# Patient Record
Sex: Female | Born: 1970 | Race: Black or African American | Hispanic: No | Marital: Married | Smoking: Current every day smoker
Health system: Southern US, Community
[De-identification: ages and names within clinical notes are randomized; demographics above are authoritative.]

## PROBLEM LIST (undated history)

## (undated) ENCOUNTER — Ambulatory Visit (HOSPITAL_COMMUNITY): Admission: EM | Payer: 59 | Source: Home / Self Care

## (undated) DIAGNOSIS — I10 Essential (primary) hypertension: Secondary | ICD-10-CM

## (undated) DIAGNOSIS — M109 Gout, unspecified: Secondary | ICD-10-CM

---

## 1998-03-31 ENCOUNTER — Emergency Department (HOSPITAL_COMMUNITY): Admission: EM | Admit: 1998-03-31 | Discharge: 1998-03-31 | Payer: Self-pay | Admitting: *Deleted

## 1999-09-11 ENCOUNTER — Emergency Department (HOSPITAL_COMMUNITY): Admission: EM | Admit: 1999-09-11 | Discharge: 1999-09-11 | Payer: Self-pay | Admitting: Emergency Medicine

## 1999-09-11 ENCOUNTER — Encounter: Payer: Self-pay | Admitting: Emergency Medicine

## 2005-02-10 ENCOUNTER — Emergency Department (HOSPITAL_COMMUNITY): Admission: EM | Admit: 2005-02-10 | Discharge: 2005-02-10 | Payer: Self-pay | Admitting: Emergency Medicine

## 2005-12-24 ENCOUNTER — Emergency Department (HOSPITAL_COMMUNITY): Admission: EM | Admit: 2005-12-24 | Discharge: 2005-12-24 | Payer: Self-pay | Admitting: Emergency Medicine

## 2006-05-05 ENCOUNTER — Emergency Department (HOSPITAL_COMMUNITY): Admission: EM | Admit: 2006-05-05 | Discharge: 2006-05-05 | Payer: Self-pay | Admitting: Emergency Medicine

## 2008-05-03 ENCOUNTER — Emergency Department (HOSPITAL_COMMUNITY): Admission: EM | Admit: 2008-05-03 | Discharge: 2008-05-03 | Payer: Self-pay | Admitting: Family Medicine

## 2008-05-04 ENCOUNTER — Emergency Department (HOSPITAL_COMMUNITY): Admission: EM | Admit: 2008-05-04 | Discharge: 2008-05-04 | Payer: Self-pay | Admitting: Emergency Medicine

## 2009-06-11 ENCOUNTER — Emergency Department (HOSPITAL_COMMUNITY): Admission: EM | Admit: 2009-06-11 | Discharge: 2009-06-11 | Payer: Self-pay | Admitting: Emergency Medicine

## 2009-10-10 ENCOUNTER — Emergency Department (HOSPITAL_COMMUNITY): Admission: EM | Admit: 2009-10-10 | Discharge: 2009-10-10 | Payer: Self-pay | Admitting: Family Medicine

## 2009-11-18 ENCOUNTER — Emergency Department (HOSPITAL_COMMUNITY): Admission: EM | Admit: 2009-11-18 | Discharge: 2009-11-18 | Payer: Self-pay | Admitting: Family Medicine

## 2010-10-06 ENCOUNTER — Emergency Department (HOSPITAL_COMMUNITY): Admission: EM | Admit: 2010-10-06 | Discharge: 2010-10-06 | Payer: Self-pay | Admitting: Emergency Medicine

## 2010-12-29 ENCOUNTER — Emergency Department (HOSPITAL_COMMUNITY)
Admission: EM | Admit: 2010-12-29 | Discharge: 2010-12-29 | Payer: Self-pay | Source: Home / Self Care | Admitting: Emergency Medicine

## 2010-12-30 LAB — POCT I-STAT, CHEM 8
BUN: 17 mg/dL (ref 6–23)
Calcium, Ion: 1.24 mmol/L (ref 1.12–1.32)
Chloride: 103 meq/L (ref 96–112)
Creatinine, Ser: 0.9 mg/dL (ref 0.4–1.2)
Glucose, Bld: 98 mg/dL (ref 70–99)
HCT: 47 % — ABNORMAL HIGH (ref 36.0–46.0)
Hemoglobin: 16 g/dL — ABNORMAL HIGH (ref 12.0–15.0)
Potassium: 3.6 mEq/L (ref 3.5–5.1)
Sodium: 141 meq/L (ref 135–145)
TCO2: 21 mmol/L (ref 0–100)

## 2010-12-30 LAB — ABO/RH: ABO/RH(D): A POS

## 2010-12-30 LAB — URINALYSIS, ROUTINE W REFLEX MICROSCOPIC
Bilirubin Urine: NEGATIVE
Ketones, ur: NEGATIVE mg/dL
Leukocytes, UA: NEGATIVE
Nitrite: NEGATIVE
Protein, ur: NEGATIVE mg/dL
Specific Gravity, Urine: 1.008 (ref 1.005–1.030)
Urine Glucose, Fasting: NEGATIVE mg/dL
Urobilinogen, UA: 0.2 mg/dL (ref 0.0–1.0)
pH: 6 (ref 5.0–8.0)

## 2010-12-30 LAB — WET PREP, GENITAL
Trich, Wet Prep: NONE SEEN
WBC, Wet Prep HPF POC: NONE SEEN
Yeast Wet Prep HPF POC: NONE SEEN

## 2010-12-30 LAB — URINE MICROSCOPIC-ADD ON

## 2010-12-30 LAB — HCG, QUANTITATIVE, PREGNANCY: hCG, Beta Chain, Quant, S: 1817 m[IU]/mL — ABNORMAL HIGH (ref <5)

## 2010-12-31 LAB — GC/CHLAMYDIA PROBE AMP, GENITAL
Chlamydia, DNA Probe: NEGATIVE
GC Probe Amp, Genital: NEGATIVE

## 2011-01-03 ENCOUNTER — Inpatient Hospital Stay (HOSPITAL_COMMUNITY)
Admission: AD | Admit: 2011-01-03 | Discharge: 2011-01-03 | Payer: Self-pay | Source: Home / Self Care | Attending: Obstetrics & Gynecology | Admitting: Obstetrics & Gynecology

## 2011-01-03 LAB — CBC
Hemoglobin: 12.7 g/dL (ref 12.0–15.0)
Platelets: 253 10*3/uL (ref 150–400)
RBC: 4.13 MIL/uL (ref 3.87–5.11)
WBC: 5.6 10*3/uL (ref 4.0–10.5)

## 2011-01-03 LAB — DIFFERENTIAL
Basophils Absolute: 0 10*3/uL (ref 0.0–0.1)
Lymphocytes Relative: 34 % (ref 12–46)
Lymphs Abs: 1.9 10*3/uL (ref 0.7–4.0)
Neutrophils Relative %: 52 % (ref 43–77)

## 2011-01-03 LAB — BUN: BUN: 6 mg/dL (ref 6–23)

## 2011-01-03 LAB — CREATININE, SERUM
Creatinine, Ser: 0.67 mg/dL (ref 0.4–1.2)
GFR calc non Af Amer: >60 mL/min (ref >60)

## 2011-01-03 LAB — AST: AST: 23 U/L (ref 0–37)

## 2011-01-03 LAB — HCG, QUANTITATIVE, PREGNANCY: hCG, Beta Chain, Quant, S: 6988 m[IU]/mL — ABNORMAL HIGH (ref <5)

## 2011-01-06 ENCOUNTER — Ambulatory Visit (HOSPITAL_COMMUNITY)
Admission: RE | Admit: 2011-01-06 | Discharge: 2011-01-06 | Payer: Self-pay | Source: Home / Self Care | Attending: Family Medicine | Admitting: Family Medicine

## 2011-01-06 DIAGNOSIS — O00109 Unspecified tubal pregnancy without intrauterine pregnancy: Secondary | ICD-10-CM

## 2011-01-07 HISTORY — PX: LAPAROSCOPY FOR ECTOPIC PREGNANCY: SUR765

## 2011-01-09 ENCOUNTER — Other Ambulatory Visit: Payer: Self-pay | Admitting: Obstetrics and Gynecology

## 2011-01-09 ENCOUNTER — Inpatient Hospital Stay (HOSPITAL_COMMUNITY)
Admission: AD | Admit: 2011-01-09 | Discharge: 2011-01-09 | Disposition: A | Discharge status: Home / Self Care | Payer: Self-pay | Source: Ambulatory Visit | Attending: Obstetrics & Gynecology | Admitting: Obstetrics & Gynecology

## 2011-01-09 ENCOUNTER — Inpatient Hospital Stay (HOSPITAL_COMMUNITY): Payer: Self-pay

## 2011-01-09 ENCOUNTER — Ambulatory Visit (HOSPITAL_COMMUNITY)
Admission: AD | Admit: 2011-01-09 | Discharge: 2011-01-10 | Disposition: A | Discharge status: Home / Self Care | Payer: Self-pay | Source: Ambulatory Visit | Attending: Obstetrics & Gynecology | Admitting: Obstetrics & Gynecology

## 2011-01-09 DIAGNOSIS — O00109 Unspecified tubal pregnancy without intrauterine pregnancy: Secondary | ICD-10-CM | POA: Insufficient documentation

## 2011-01-09 DIAGNOSIS — A5485 Gonococcal peritonitis: Secondary | ICD-10-CM | POA: Insufficient documentation

## 2011-01-09 DIAGNOSIS — N736 Female pelvic peritoneal adhesions (postinfective): Secondary | ICD-10-CM | POA: Insufficient documentation

## 2011-01-09 LAB — SAMPLE TO BLOOD BANK

## 2011-01-09 LAB — CBC
HCT: 34.3 % — ABNORMAL LOW (ref 36.0–46.0)
Hemoglobin: 12.2 g/dL (ref 12.0–15.0)
Hemoglobin: 11.5 g/dL — ABNORMAL LOW (ref 12.0–15.0)
MCH: 31.4 pg (ref 26.0–34.0)
MCHC: 33.2 g/dL (ref 30.0–36.0)
MCV: 94.6 fL (ref 78.0–100.0)
MCV: 92.7 fL (ref 78.0–100.0)
RBC: 3.89 MIL/uL (ref 3.87–5.11)
WBC: 7.5 10*3/uL (ref 4.0–10.5)

## 2011-01-09 LAB — CREATININE, SERUM
Creatinine, Ser: 0.64 mg/dL (ref 0.4–1.2)
GFR calc non Af Amer: >60 mL/min (ref >60)

## 2011-01-09 LAB — DIFFERENTIAL
Basophils Relative: 0 % (ref 0–1)
Eosinophils Absolute: 0.1 10*3/uL (ref 0.0–0.7)
Lymphs Abs: 2.1 10*3/uL (ref 0.7–4.0)
Monocytes Absolute: 0.6 10*3/uL (ref 0.1–1.0)
Monocytes Relative: 12 % (ref 3–12)
Neutro Abs: 2.7 10*3/uL (ref 1.7–7.7)

## 2011-01-09 LAB — AST: AST: 15 U/L (ref 0–37)

## 2011-01-19 NOTE — Op Note (Addendum)
Samantha Olsen, MCANELLY NO.:  [id number]  MEDICAL RECORD NO.:  [id number]           PATIENT TYPE:  I  LOCATION:  [facility]                         FAC[facility]  PHYSICIAN:  Tilda Burrow, M.D. DATE OF BIRTH:  [birth year]  DATE OF PROCEDURE:  01/09/2011 DATE OF DISCHARGE:  01/09/2011                              OPERATIVE REPORT   PREOPERATIVE DIAGNOSIS:  Ruptured right ectopic pregnancy.  POSTOPERATIVE DIAGNOSES: 1. Ruptured right ectopic pregnancy. 2. Extensive pelvic adhesions. 3. Fitz-Hugh-Curtis syndrome with perihepatic adhesions.  PROCEDURES: 1. Laparoscopic right salpingectomy. 2. Lysis of pelvic adhesions. 3. Lysis of perihepatic adhesions.  SURGEON:  Tilda Burrow, MD  ASSISTANT:  Lucina Mellow, DO  ANESTHESIA:  General.  COMPLICATIONS:  None.  FINDINGS: 1. Large ampullary ectopic with generous bleeding in to the pelvis. 2. Extensive thin filmy adhesions and omental adhesions to the pelvis,    particularly in the left adnexa, released sufficiently to identify     the left ovary and the left fallopian tube.  A small area of distal     fimbrial tissue on the left could be identified.  Large right     ampullary ectopic pregnancy.  DESCRIPTION OF PROCEDURE:  The adhesions were freed up, so that the omentum could be elevated.  The patient was placed in Trendelenburg position and the pelvis irrigated somewhat.  Photos were taken.  Photos 2, 3, and 4 showing the dissection of the left pelvis.  The right tube was a large distended tube and proximal to the tube, there was an area of firm nodularity right at the corner of the right tube.  This was felt to represent probably endometriosis, that possibility of salpingitis isthmic nodosa is considered.  Given the firm fibrous hard nature of this, it was felt that salvage in the tube would not result in increased fertility, so salpingectomy was selected.  The mesosalpinx was injected with  Pitressin diluted solution 15 mL of solution with 0.2 units/mL. This resulted in satisfactory blanching and excellent hemostasis. Unipolar cautery was used to take the mesosalpinx down and taken off the tubal stump.  The pedicle was inspected for hemostasis and was quite dry.  Pelvis was irrigated further and then the attention directed to the upper abdomen.  There was extensive old firm adhesions to the liver consistent with Fitz-Hugh-Curtis syndrome.  These were inspected.  There was also a lot of blood in the lateral gutter and perihepatic area.  The adhesions were taken down sharply and were quite avascular.  We then were able to irrigate out the perihepatic blood and then suctioned out the pelvic and the lateral side wall.  Approximately 100 mL of saline was left in the abdomen which still had a bloody color but was dramatically improved from initiation of procedure.  The procedure was considered satisfactorily completed.  The abdomen deflated.  A 100 mL of saline left in the abdomen to help with evacuation of carbon dioxide, then the laparoscopic instruments were removed.  The fascia was closed at the umbilicus and the each incision closed subcuticularly with 4-0 Vicryl.  Steri-Strips were applied.  ADDENDUM:  It should be mentioned that an EndoCatch bag placed through the umbilical port while viewing things through the 5-mm right lower quadrant port was used to extract the ectopic.  The patient's blood type was confirmed as Rh positive and the patient will be given Toradol tonight before discharge.     Tilda Burrow, M.D.     JVF/MEDQ  D:  01/09/2011  T:  01/10/2011  Job:  [id number]  Electronically Signed by Christin Bach M.D. on 01/19/2011 06:34:45 PM

## 2011-03-15 LAB — DIFFERENTIAL
Eosinophils Absolute: 0.1 10*3/uL (ref 0.0–0.7)
Eosinophils Relative: 1 % (ref 0–5)
Lymphs Abs: 1.7 10*3/uL (ref 0.7–4.0)
Monocytes Absolute: 0.5 10*3/uL (ref 0.1–1.0)
Monocytes Relative: 9 % (ref 3–12)
Neutrophils Relative %: 54 % (ref 43–77)

## 2011-03-15 LAB — CBC
HCT: 35.4 % — ABNORMAL LOW (ref 36.0–46.0)
Hemoglobin: 12.2 g/dL (ref 12.0–15.0)
MCV: 96.6 fL (ref 78.0–100.0)
RBC: 3.66 MIL/uL — ABNORMAL LOW (ref 3.87–5.11)
WBC: 4.9 10*3/uL (ref 4.0–10.5)

## 2011-03-15 LAB — URINALYSIS, ROUTINE W REFLEX MICROSCOPIC
Glucose, UA: NEGATIVE mg/dL
Ketones, ur: NEGATIVE mg/dL
pH: 5.5 (ref 5.0–8.0)

## 2011-03-15 LAB — BASIC METABOLIC PANEL
Chloride: 112 mEq/L (ref 96–112)
GFR calc Af Amer: >60 mL/min (ref >60)
Potassium: 3.9 mEq/L (ref 3.5–5.1)
Sodium: 138 mEq/L (ref 135–145)

## 2011-05-13 ENCOUNTER — Emergency Department (HOSPITAL_COMMUNITY)
Admission: EM | Admit: 2011-05-13 | Discharge: 2011-05-13 | Disposition: A | Discharge status: Home / Self Care | Payer: Self-pay | Attending: Emergency Medicine | Admitting: Emergency Medicine

## 2011-05-13 ENCOUNTER — Emergency Department (HOSPITAL_COMMUNITY): Payer: Self-pay

## 2011-05-13 DIAGNOSIS — R059 Cough, unspecified: Secondary | ICD-10-CM | POA: Insufficient documentation

## 2011-05-13 DIAGNOSIS — R0602 Shortness of breath: Secondary | ICD-10-CM | POA: Insufficient documentation

## 2011-05-13 DIAGNOSIS — J40 Bronchitis, not specified as acute or chronic: Secondary | ICD-10-CM | POA: Insufficient documentation

## 2011-05-13 DIAGNOSIS — R05 Cough: Secondary | ICD-10-CM | POA: Insufficient documentation

## 2011-05-13 DIAGNOSIS — F17200 Nicotine dependence, unspecified, uncomplicated: Secondary | ICD-10-CM | POA: Insufficient documentation

## 2011-06-17 ENCOUNTER — Inpatient Hospital Stay (HOSPITAL_COMMUNITY)
Admission: EM | Admit: 2011-06-17 | Discharge: 2011-06-17 | Disposition: A | Discharge status: Home / Self Care | Payer: Self-pay | Source: Ambulatory Visit | Attending: Obstetrics and Gynecology | Admitting: Obstetrics and Gynecology

## 2011-06-17 ENCOUNTER — Encounter (HOSPITAL_COMMUNITY): Payer: Self-pay

## 2011-06-17 DIAGNOSIS — N946 Dysmenorrhea, unspecified: Secondary | ICD-10-CM | POA: Insufficient documentation

## 2011-06-17 DIAGNOSIS — Z3202 Encounter for pregnancy test, result negative: Secondary | ICD-10-CM | POA: Insufficient documentation

## 2011-06-17 HISTORY — DX: Essential (primary) hypertension: I10

## 2011-06-17 NOTE — ED Provider Notes (Signed)
History   Pt is a G1P0 ectopic 1. She presents today concerned that she may be pregnant again. She states she only had a 1day period on 05/26/11. She denies abd pain, vag dc, bleeding, or any problems. She states she just wanted to make sure she was not pregnant.  Chief Complaint  Patient presents with  . Dysmenorrhea   HPI  OB History    Grav Para Term Preterm Abortions TAB SAB Ect Mult Living   1    1   1   0      Past Medical History  Diagnosis Date  . Hypertension     Past Surgical History  Procedure Date  . Laparoscopy for ectopic pregnancy 01/2011    No family history on file.  History  Substance Use Topics  . Smoking status: Current Everyday Smoker -- 0.5 packs/day  . Smokeless tobacco: Not on file  . Alcohol Use: 0.6 oz/week    1 Cans of beer per week    Allergies: No Known Allergies  No prescriptions prior to admission    Review of Systems  All other systems reviewed and are negative.   Physical Exam   Blood pressure 143/86, pulse 86, temperature 100.2 F (37.9 C), temperature source Oral, resp. rate 16, height 5\' 4"  (1.626 m), weight 196 lb (88.905 kg), last menstrual period 05/26/2011.  Physical Exam  Constitutional: She is oriented to person, place, and time. She appears well-developed and well-nourished.  GI: Soft. She exhibits no distension. There is no tenderness. There is no rebound and no guarding.  Neurological: She is alert and oriented to person, place, and time.  Skin: Skin is warm and dry.    [facility] Course  Procedures   Urine preg test negative.  A/P: 1) preg test: pt is currently not pregnant. She will f/u with her pcp. Discussed diet, activity, risks, and precautions.   Henrietta Hoover, [place] 06/17/11 [phone]

## 2011-06-17 NOTE — Progress Notes (Signed)
Pt states lmp-05/26/2011, lasting 1 day, denies pain or bleeding. No abnormal vag d/c changes to note.

## 2011-06-17 NOTE — ED Notes (Signed)
Dr. Dimple Casey in room, discussed pt's reason for visit.

## 2011-11-02 ENCOUNTER — Encounter (HOSPITAL_COMMUNITY): Payer: Self-pay | Admitting: *Deleted

## 2011-11-02 ENCOUNTER — Emergency Department (HOSPITAL_COMMUNITY): Payer: Self-pay

## 2011-11-02 ENCOUNTER — Emergency Department (HOSPITAL_COMMUNITY)
Admission: EM | Admit: 2011-11-02 | Discharge: 2011-11-02 | Disposition: A | Discharge status: Home / Self Care | Payer: Self-pay | Attending: Emergency Medicine | Admitting: Emergency Medicine

## 2011-11-02 DIAGNOSIS — R05 Cough: Secondary | ICD-10-CM

## 2011-11-02 DIAGNOSIS — I10 Essential (primary) hypertension: Secondary | ICD-10-CM | POA: Insufficient documentation

## 2011-11-02 DIAGNOSIS — J40 Bronchitis, not specified as acute or chronic: Secondary | ICD-10-CM | POA: Insufficient documentation

## 2011-11-02 DIAGNOSIS — F17200 Nicotine dependence, unspecified, uncomplicated: Secondary | ICD-10-CM | POA: Insufficient documentation

## 2011-11-02 LAB — POCT I-STAT, CHEM 8
Chloride: 106 mEq/L (ref 96–112)
Creatinine, Ser: 0.7 mg/dL (ref 0.50–1.10)
Glucose, Bld: 102 mg/dL — ABNORMAL HIGH (ref 70–99)
Potassium: 3.7 mEq/L (ref 3.5–5.1)

## 2011-11-02 LAB — DIFFERENTIAL
Eosinophils Absolute: 0.1 10*3/uL (ref 0.0–0.7)
Lymphs Abs: 1.7 10*3/uL (ref 0.7–4.0)
Monocytes Relative: 21 % — ABNORMAL HIGH (ref 3–12)
Neutro Abs: 1.6 10*3/uL — ABNORMAL LOW (ref 1.7–7.7)
Neutrophils Relative %: 38 % — ABNORMAL LOW (ref 43–77)

## 2011-11-02 LAB — CBC
Hemoglobin: 11.5 g/dL — ABNORMAL LOW (ref 12.0–15.0)
MCH: 31.6 pg (ref 26.0–34.0)
RBC: 3.64 MIL/uL — ABNORMAL LOW (ref 3.87–5.11)

## 2011-11-02 MED ORDER — ALBUTEROL SULFATE HFA 108 (90 BASE) MCG/ACT IN AERS
2.0000 | INHALATION_SPRAY | RESPIRATORY_TRACT | Status: DC | PRN
  Administered 2011-11-02: 2 via RESPIRATORY_TRACT
  Filled 2011-11-02: qty 6.7

## 2011-11-02 MED ORDER — HYDROCOD POLST-CHLORPHEN POLST 10-8 MG/5ML PO LQCR
5.0000 mL | Freq: Once | ORAL | Status: AC
  Administered 2011-11-02: 5 mL via ORAL
  Filled 2011-11-02: qty 5

## 2011-11-02 MED ORDER — HYDROCOD POLST-CHLORPHEN POLST 10-8 MG/5ML PO LQCR: 5.0000 mL | Freq: Two times a day (BID) | ORAL | Status: DC

## 2011-11-02 NOTE — ED Notes (Signed)
Pt states that she has generalized pain from "head to toe" that started about 2 weeks ago.  3 days ago, the pain increased and she became increasingly dizzy.  Pt confirms vomiting x1 and denies nausea and diarrhea.  Pt states she has a productive cough.

## 2011-11-02 NOTE — ED Provider Notes (Signed)
Medical screening examination/treatment/procedure(s) were performed by non-physician practitioner and as supervising physician I was immediately available for consultation/collaboration.  Nicholes Stairs, MD 11/02/11 2258

## 2011-11-02 NOTE — ED Provider Notes (Signed)
History     CSN: [id number] Arrival date & time: 11/02/2011  2:02 AM   First MD Initiated Contact with Patient 11/02/11 0401      Chief Complaint  Patient presents with  . Chills    and body aches   HPI  History provided by the patient and her she other. Patient with history of hypertension who is a current smoker, presents with complaints of general body aches, cough and congestion that has increased over the last 3 days. Patient states that she had some cough and congestion that began 2 weeks ago and has been persistent but symptoms worsened over the last 3 days with general body aches. Cough has been productive of yellow sputum. Patient has been around her young nieces who have had cough and cold symptoms. Patient has no other significant past medical history.   Past Medical History  Diagnosis Date  . Hypertension     Past Surgical History  Procedure Date  . Laparoscopy for ectopic pregnancy 01/2011    History reviewed. No pertinent family history.  History  Substance Use Topics  . Smoking status: Current Everyday Smoker -- 0.5 packs/day  . Smokeless tobacco: Not on file  . Alcohol Use: 0.6 oz/week    1 Cans of beer per week    OB History    Grav Para Term Preterm Abortions TAB SAB Ect Mult Living   1    1   1   0      Review of Systems  Constitutional: Positive for fever, chills and fatigue.  HENT: Positive for congestion and rhinorrhea. Negative for ear pain and sore throat.   Respiratory: Positive for cough. Negative for shortness of breath.   Cardiovascular: Negative for chest pain.  Gastrointestinal: Positive for vomiting. Negative for nausea, abdominal pain, diarrhea and constipation.  Genitourinary: Negative for dysuria, frequency, hematuria, flank pain, vaginal bleeding and vaginal discharge.  Neurological: Positive for light-headedness and headaches.  All other systems reviewed and are negative.    Allergies  Review of patient's allergies indicates  no known allergies.  Home Medications   Current Outpatient Rx  Name Route Sig Dispense Refill  . THERA M PLUS PO TABS Oral Take 1 tablet by mouth daily.      Marland Kitchen VITAMIN C 500 MG PO TABS Oral Take 500 mg by mouth 2 (two) times daily.        BP 121/80  Pulse 77  Temp(Src) 98.4 F (36.9 C) (Oral)  Resp 19  Physical Exam  Nursing note and vitals reviewed. Constitutional: She is oriented to person, place, and time. She appears well-developed and well-nourished. No distress.  HENT:  Head: Atraumatic.  Right Ear: External ear normal.  Left Ear: External ear normal.  Mouth/Throat: Oropharynx is clear and moist.  Eyes: Conjunctivae and EOM are normal. Pupils are equal, round, and reactive to light.  Neck:       No meningeal signs  Cardiovascular: Normal rate and regular rhythm.   Murmur heard. Pulmonary/Chest: Effort normal and breath sounds normal. No respiratory distress. She has no wheezes. She has no rales. She exhibits no tenderness.  Abdominal: Soft. She exhibits no distension. There is no tenderness. There is no rebound, no guarding and no CVA tenderness.  Neurological: She is alert and oriented to person, place, and time.  Skin: Skin is warm. No rash noted.  Psychiatric: She has a normal mood and affect. Her behavior is normal.    ED Course  Procedures (including critical care time)  Labs Reviewed  CBC - Abnormal; Notable for the following:    RBC 3.64 (*)    Hemoglobin 11.5 (*)    HCT 34.3 (*)    All other components within normal limits  DIFFERENTIAL - Abnormal; Notable for the following:    Neutrophils Relative 38 (*)    Neutro Abs 1.6 (*)    Monocytes Relative 21 (*)    All other components within normal limits  POCT I-STAT, CHEM 8 - Abnormal; Notable for the following:    Glucose, Bld 102 (*)    All other components within normal limits  I-STAT, CHEM 8   Results for orders placed during the hospital encounter of 11/02/11  CBC      Component Value Range    WBC 4.2  4.0 - 10.5 (K/uL)   RBC 3.64 (*) 3.87 - 5.11 (MIL/uL)   Hemoglobin 11.5 (*) 12.0 - 15.0 (g/dL)   HCT 40.9 (*) 81.1 - 46.0 (%)   MCV 94.2  78.0 - 100.0 (fL)   MCH 31.6  26.0 - 34.0 (pg)   MCHC 33.5  30.0 - 36.0 (g/dL)   RDW 91.4  78.2 - 95.6 (%)   Platelets 193  150 - 400 (K/uL)  DIFFERENTIAL      Component Value Range   Neutrophils Relative 38 (*) 43 - 77 (%)   Neutro Abs 1.6 (*) 1.7 - 7.7 (K/uL)   Lymphocytes Relative 40  12 - 46 (%)   Lymphs Abs 1.7  0.7 - 4.0 (K/uL)   Monocytes Relative 21 (*) 3 - 12 (%)   Monocytes Absolute 0.9  0.1 - 1.0 (K/uL)   Eosinophils Relative 1  0 - 5 (%)   Eosinophils Absolute 0.1  0.0 - 0.7 (K/uL)   Basophils Relative 0  0 - 1 (%)   Basophils Absolute 0.0  0.0 - 0.1 (K/uL)  POCT I-STAT, CHEM 8      Component Value Range   Sodium 139  135 - 145 (mEq/L)   Potassium 3.7  3.5 - 5.1 (mEq/L)   Chloride 106  96 - 112 (mEq/L)   BUN 9  6 - 23 (mg/dL)   Creatinine, Ser 2.13  0.50 - 1.10 (mg/dL)   Glucose, Bld 086 (*) 70 - 99 (mg/dL)   Calcium, Ion 5.78  4.69 - 1.32 (mmol/L)   TCO2 20  0 - 100 (mmol/L)   Hemoglobin 12.6  12.0 - 15.0 (g/dL)   HCT 62.9  52.8 - 41.3 (%)     Dg Chest 2 View  11/02/2011  *RADIOLOGY REPORT*  Clinical Data: Cough, congestion and chills; history of smoking.  CHEST - 2 VIEW  Comparison: Chest radiograph performed 05/13/2011  Findings: The lungs are well-aerated and clear.  There is no evidence of focal opacification, pleural effusion or pneumothorax.  The heart is normal in size; the mediastinal contour is within normal limits.  No acute osseous abnormalities are seen.  IMPRESSION: No acute cardiopulmonary process seen.  Original Report Authenticated By: Tonia Ghent, M.D.     1. Cough   2. Bronchitis       MDM  4:15 AM patient seen and evaluated. Patient in no acute distress with normal respirations and good O2 saturation.        Angus Seller, [place] 11/02/11 [phone]

## 2012-01-28 ENCOUNTER — Encounter (HOSPITAL_COMMUNITY): Payer: Self-pay | Admitting: *Deleted

## 2012-01-28 ENCOUNTER — Emergency Department (HOSPITAL_COMMUNITY)
Admission: EM | Admit: 2012-01-28 | Discharge: 2012-01-28 | Disposition: A | Discharge status: Home / Self Care | Payer: Self-pay | Attending: Emergency Medicine | Admitting: Emergency Medicine

## 2012-01-28 DIAGNOSIS — J9801 Acute bronchospasm: Secondary | ICD-10-CM | POA: Insufficient documentation

## 2012-01-28 DIAGNOSIS — I10 Essential (primary) hypertension: Secondary | ICD-10-CM | POA: Insufficient documentation

## 2012-01-28 DIAGNOSIS — J40 Bronchitis, not specified as acute or chronic: Secondary | ICD-10-CM | POA: Insufficient documentation

## 2012-01-28 DIAGNOSIS — F17200 Nicotine dependence, unspecified, uncomplicated: Secondary | ICD-10-CM | POA: Insufficient documentation

## 2012-01-28 MED ORDER — AZITHROMYCIN 250 MG PO TABS: 250.0000 mg | ORAL_TABLET | Freq: Every day | ORAL | Status: AC

## 2012-01-28 MED ORDER — ALBUTEROL SULFATE HFA 108 (90 BASE) MCG/ACT IN AERS
2.0000 | INHALATION_SPRAY | RESPIRATORY_TRACT | Status: DC
  Administered 2012-01-28: 2 via RESPIRATORY_TRACT
  Filled 2012-01-28: qty 6.7

## 2012-01-28 NOTE — ED Provider Notes (Signed)
History     CSN: [id number]  Arrival date & time 01/28/12  0727   First MD Initiated Contact with Patient 01/28/12 [phone]      Chief Complaint  Patient presents with  . Cough    (Consider location/radiation/quality/duration/timing/severity/associated sxs/prior treatment) Patient is a 41 y.o. female presenting with cough. The history is provided by the patient.  Cough   the patient reports to 4 days of productive cough with associated "dry cough".  She denies fevers or chills.  She still smokes cigarettes.  She denies shortness of breath.  She denies abdominal pain.  She denies nausea vomiting and diarrhea.  She denies chest pain or chest tightness.  She reports difficulty sleeping secondary to cough.  She reports her cough is worse when she lays flat.  She has nasal drainage.  Nothing improves her symptoms.  Symptoms are constant.  They're mild to moderate in severity.  Past Medical History  Diagnosis Date  . Hypertension     Past Surgical History  Procedure Date  . Laparoscopy for ectopic pregnancy 01/2011    No family history on file.  History  Substance Use Topics  . Smoking status: Current Everyday Smoker -- 0.5 packs/day  . Smokeless tobacco: Not on file  . Alcohol Use: 0.6 oz/week    1 Cans of beer per week    OB History    Grav Para Term Preterm Abortions TAB SAB Ect Mult Living   1    1   1   0      Review of Systems  Respiratory: Positive for cough.   All other systems reviewed and are negative.    Allergies  Review of patient's allergies indicates no known allergies.  Home Medications   Current Outpatient Rx  Name Route Sig Dispense Refill  . AZITHROMYCIN 250 MG PO TABS Oral Take 1 tablet (250 mg total) by mouth daily. Take 2 tabs for first dose, then 1 tab for each additional dose 6 tablet 0  . THERA M PLUS PO TABS Oral Take 1 tablet by mouth daily.      Marland Kitchen VITAMIN C 500 MG PO TABS Oral Take 500 mg by mouth 2 (two) times daily.        BP 139/91   Pulse 82  Temp(Src) 98.3 F (36.8 C) (Oral)  Resp 18  Wt 180 lb (81.647 kg)  SpO2 100%  LMP 01/21/2012  Physical Exam  Nursing note and vitals reviewed. Constitutional: She is oriented to person, place, and time. She appears well-developed and well-nourished. No distress.  HENT:  Head: Normocephalic and atraumatic.  Eyes: EOM are normal.  Neck: Normal range of motion.  Cardiovascular: Normal rate, regular rhythm and normal heart sounds.   Pulmonary/Chest: Effort normal and breath sounds normal.  Abdominal: Soft. She exhibits no distension. There is no tenderness.  Musculoskeletal: Normal range of motion.  Neurological: She is alert and oriented to person, place, and time.  Skin: Skin is warm and dry.  Psychiatric: She has a normal mood and affect. Judgment normal.    ED Course  Procedures (including critical care time)  Labs Reviewed - No data to display No results found.   1. Bronchitis   2. Bronchospasm       MDM  Suspect bronchitis with bronchospasm.  Albuterol inhaler in the ER.  Home with azithromycin.  Close PCP followup.  The patient is well-appearing.  Her pulse oximetry is normal.  Vitals are otherwise normal.  Lyanne Co, MD 01/28/12 [phone]

## 2012-01-28 NOTE — Discharge Instructions (Signed)
Bronchitis Bronchitis is the body's way of reacting to injury and/or infection (inflammation) of the bronchi. Bronchi are the air tubes that extend from the windpipe into the lungs. If the inflammation becomes severe, it may cause shortness of breath. CAUSES  Inflammation may be caused by:  A virus.   Germs (bacteria).   Dust.   Allergens.   Pollutants and many other irritants.  The cells lining the bronchial tree are covered with tiny hairs (cilia). These constantly beat upward, away from the lungs, toward the mouth. This keeps the lungs free of pollutants. When these cells become too irritated and are unable to do their job, mucus begins to develop. This causes the characteristic cough of bronchitis. The cough clears the lungs when the cilia are unable to do their job. Without either of these protective mechanisms, the mucus would settle in the lungs. Then you would develop pneumonia. Smoking is a common cause of bronchitis and can contribute to pneumonia. Stopping this habit is the single most important thing you can do to help yourself. TREATMENT   Your caregiver may prescribe an antibiotic if the cough is caused by bacteria. Also, medicines that open up your airways make it easier to breathe. Your caregiver may also recommend or prescribe an expectorant. It will loosen the mucus to be coughed up. Only take over-the-counter or prescription medicines for pain, discomfort, or fever as directed by your caregiver.   Removing whatever causes the problem (smoking, for example) is critical to preventing the problem from getting worse.   Cough suppressants may be prescribed for relief of cough symptoms.   Inhaled medicines may be prescribed to help with symptoms now and to help prevent problems from returning.   For those with recurrent (chronic) bronchitis, there may be a need for steroid medicines.  SEEK IMMEDIATE MEDICAL CARE IF:   During treatment, you develop more pus-like mucus  (purulent sputum).   You have a fever.   Your baby is older than 3 months with a rectal temperature of 102 F (38.9 C) or higher.   Your baby is 41 months old or younger with a rectal temperature of 100.4 F (38 C) or higher.   You become progressively more ill.   You have increased difficulty breathing, wheezing, or shortness of breath.  It is necessary to seek immediate medical care if you are elderly or sick from any other disease. MAKE SURE YOU:   Understand these instructions.   Will watch your condition.   Will get help right away if you are not doing well or get worse.  Document Released: 11/23/2005 Document Revised: 08/05/2011 Document Reviewed: 10/02/2008 [facility] Patient Information 2012 [place], [place].Bronchospasm, Adult Bronchospasm means that there is a spasm or tightening of the airways going into the lungs. Because the airways go into a spasm and get smaller it makes breathing more difficult. For reasons not completely known, workings (functions) of the airways designed to protect the lungs become over active. This causes the airways to become more sensitive to:  Infection.   Weather.   Exercise.   Irritants.   Things that cause allergic reactions or allergies (allergens).  Frequent coughing or respiratory episodes should be checked for the cause. This condition may be made worse by exercise. CAUSES  Inflammation is often the cause of this condition. Allergy, viral respiratory infections, or irritants in the air often cause this problem. Allergic reactions produce immediate and delayed responses. Late reactions may produce more serious inflammation. This may lead to increased  reactivity of the airways. Sometimes this is inherited. Some common triggers are:  Allergies.   Infection commonly triggers attacks. Antibiotics are not helpful for viral infections and usually do not help with attacks of bronchospasm.   Exercise (running, etc.) can trigger an attack.  Proper pre-exercise medications help most individuals participate in sports. Swimming is the least likely sport to cause problems.   Irritants (for example, pollution, cigarette smoke, strong odors, aerosol sprays, paint fumes, etc.) may trigger attacks. You cannot smoke and do not allow smoking in your home. This is absolutely necessary. Show this instruction to mates, relatives and significant others that may not agree with you.   Weather changes may cause lung problems but moving around trying to find an ideal climate does not seem to be overly helpful. Winds increase molds and pollens in the air. Rain refreshes the air by washing irritants out. Cold air may cause irritation.   Emotional problems do not cause lung problems but can trigger attacks.  SYMPTOMS  Wheezing is the most common symptom. Frequent coughing (with or without exercise and or crying) and repeated respiratory infections are all early warning signs of bronchospasm. Chest tightness and shortness of breath are other symptoms. DIAGNOSIS  Early hidden bronchospasm may go for long periods of time without being detected. This is especially true if wheezing cannot be detected by your caregiver. Lung (pulmonary) function studies may help with diagnosis in these cases. HOME CARE INSTRUCTIONS   It is necessary to remain calm during an attack. Try to relax and breathe more slowly. During this time medications may be given. If any breathing problems seem to be getting worse and are unresponsive to treatment seek immediate medical care.   If you have severe breathing difficulty or have had a life threatening attack it is probably a good idea for you to learn how to give adrenaline (epi-pen) or use an anaphylaxis kit. Your caregiver can help you with this. These are the same kits carried by people who have severe allergic reactions. This is especially important if you do not have readily accessible medical care.   With any severe breathing  problems where epinephrine (adrenaline) has been given at home call 911 immediately as the delayed reaction may be even more severe.  SEEK MEDICAL CARE IF:   There is wheezing and shortness of breath, even if medications are given to prevent attacks.   An oral temperature above 102 F (38.9 C) develops.   There are muscle aches, chest pain, or thickening of sputum.   The sputum changes from clear or white to yellow, green, gray, or bloody.   There are problems that may be related to the medicine you are given, such as a rash, itching, swelling, or trouble breathing.  SEEK IMMEDIATE MEDICAL CARE IF:   The usual medicines do not stop your wheezing, or there is increased coughing.   You have increased difficulty breathing.  MAKE SURE YOU:   Understand these instructions.   Will watch your condition.   Will get help right away if you are not doing well or get worse.  Document Released: 11/26/2003 Document Revised: 08/05/2011 Document Reviewed: 07/11/2008 [facility] Patient Information 2012 [place], [place].

## 2012-01-28 NOTE — ED Notes (Signed)
Pt states "been coughing for 4 days, cough up yellow"

## 2012-01-28 NOTE — ED Notes (Signed)
Pt reports coughing up yellow phlegm x4 days. Reports pain rated 2/10 in abdomen, sore from coughing so much. Denies any other symptoms.Pt is A/O x4. Skin warm and dry. Respirations even and unlabored. NAD noted at this time.

## 2012-06-03 ENCOUNTER — Encounter (HOSPITAL_COMMUNITY): Payer: Self-pay | Admitting: Emergency Medicine

## 2012-06-03 ENCOUNTER — Emergency Department (HOSPITAL_COMMUNITY)
Admission: EM | Admit: 2012-06-03 | Discharge: 2012-06-03 | Disposition: A | Discharge status: Home / Self Care | Payer: Self-pay | Attending: Emergency Medicine | Admitting: Emergency Medicine

## 2012-06-03 DIAGNOSIS — R110 Nausea: Secondary | ICD-10-CM | POA: Insufficient documentation

## 2012-06-03 DIAGNOSIS — I10 Essential (primary) hypertension: Secondary | ICD-10-CM | POA: Insufficient documentation

## 2012-06-03 DIAGNOSIS — R42 Dizziness and giddiness: Secondary | ICD-10-CM | POA: Insufficient documentation

## 2012-06-03 DIAGNOSIS — F17200 Nicotine dependence, unspecified, uncomplicated: Secondary | ICD-10-CM | POA: Insufficient documentation

## 2012-06-03 LAB — CBC WITH DIFFERENTIAL/PLATELET
Basophils Absolute: 0 10*3/uL (ref 0.0–0.1)
Basophils Relative: 0 % (ref 0–1)
Eosinophils Relative: 1 % (ref 0–5)
Lymphocytes Relative: 35 % (ref 12–46)
MCHC: 34.4 g/dL (ref 30.0–36.0)
Neutro Abs: 3.3 10*3/uL (ref 1.7–7.7)
Platelets: 216 10*3/uL (ref 150–400)
RDW: 14 % (ref 11.5–15.5)
WBC: 6.3 10*3/uL (ref 4.0–10.5)

## 2012-06-03 LAB — URINALYSIS, ROUTINE W REFLEX MICROSCOPIC
Glucose, UA: NEGATIVE mg/dL
Ketones, ur: NEGATIVE mg/dL
Leukocytes, UA: NEGATIVE
Nitrite: NEGATIVE
Protein, ur: NEGATIVE mg/dL
Urobilinogen, UA: 0.2 mg/dL (ref 0.0–1.0)

## 2012-06-03 LAB — BASIC METABOLIC PANEL
CO2: 22 mEq/L (ref 19–32)
Calcium: 9.4 mg/dL (ref 8.4–10.5)
Creatinine, Ser: 0.74 mg/dL (ref 0.50–1.10)
GFR calc Af Amer: >90 mL/min (ref >90)
Sodium: 138 mEq/L (ref 135–145)

## 2012-06-03 LAB — PREGNANCY, URINE: Preg Test, Ur: NEGATIVE

## 2012-06-03 MED ORDER — ONDANSETRON HCL 4 MG/2ML IJ SOLN
4.0000 mg | Freq: Once | INTRAMUSCULAR | Status: AC
  Administered 2012-06-03: 4 mg via INTRAVENOUS
  Filled 2012-06-03: qty 2

## 2012-06-03 MED ORDER — SODIUM CHLORIDE 0.9 % IV SOLN
INTRAVENOUS | Status: DC
  Administered 2012-06-03: 125 mL/h via INTRAVENOUS

## 2012-06-03 MED ORDER — SODIUM CHLORIDE 0.9 % IV BOLUS (SEPSIS)
1000.0000 mL | Freq: Once | INTRAVENOUS | Status: AC
  Administered 2012-06-03: 1000 mL via INTRAVENOUS

## 2012-06-03 MED ORDER — ONDANSETRON HCL 8 MG PO TABS: 8.0000 mg | ORAL_TABLET | Freq: Three times a day (TID) | ORAL | Status: AC | PRN

## 2012-06-03 NOTE — ED Notes (Signed)
Pt. Stated, i woke up about a hour ago with a heADACHE and dizziness.

## 2012-06-03 NOTE — Discharge Instructions (Signed)
B.R.A.T. Diet Your doctor has recommended the B.R.A.T. diet for you or your child until the condition improves. This is often used to help control diarrhea and vomiting symptoms. If you or your child can tolerate clear liquids, you may have:  Bananas.   Rice.   Applesauce.   Toast (and other simple starches such as crackers, potatoes, noodles).  Be sure to avoid dairy products, meats, and fatty foods until symptoms are better. Fruit juices such as apple, grape, and prune juice can make diarrhea worse. Avoid these. Continue this diet for 2 days or as instructed by your caregiver. Document Released: 11/23/2005 Document Revised: 11/12/2011 Document Reviewed: 05/12/2007 [facility] Patient Information 2012 [place].Clear Liquid Diet The clear liquid dietconsists of foods that are liquid or will become liquid at room temperature.You should be able to see through the liquid and beverages. Examples of foods allowed on a clear liquid diet include fruit juice, broth or bouillon, gelatin, or frozen ice pops. The purpose of this diet is to provide necessary fluid, electrolytes such as sodium and potassium, and energy to keep the body functioning during times when you are not able to consume a regular diet.A clear liquid diet should not be continued for long periods of time as it is not nutritionally adequate.  REASONS FOR USING A CLEAR LIQUID DIET  In sudden onset (acute) conditions for a patient before or after surgery.   As the first step in oral feeding.   For fluid and electrolyte replacement in diarrheal diseases.   As a diet before certain medical tests are performed.  ADEQUACY The clear liquid diet is adequate only in ascorbic acid, according to the Recommended Dietary Allowances of the [employer]. CHOOSING FOODS Breads and Starches  Allowed:  None are allowed.   Avoid: All are avoided.  Vegetables  Allowed:  Strained tomato or vegetable juice.   Avoid: Any  others.  Fruit  Allowed:  Strained fruit juices and fruit drinks. Include 1 serving of citrus or vitamin C-enriched fruit juice daily.   Avoid: Any others.  Meat and Meat Substitutes  Allowed:  None are allowed.   Avoid: All are avoided.  Milk  Allowed:  None are allowed.   Avoid: All are avoided.  Soups and Combination Foods  Allowed:  Clear bouillon, broth, or strained broth-based soups.   Avoid: Any others.  Desserts and Sweets  Allowed:  Sugar, honey. High protein gelatin. Flavored gelatin, ices, or frozen ice pops that do not contain milk.   Avoid: Any others.  Fats and Oils  Allowed:  None are allowed.   Avoid: All are avoided.  Beverages  Allowed: Cereal beverages, coffee (regular or decaffeinated), tea, or soda at the discretion of your caregiver.   Avoid: Any others.  Condiments  Allowed:  Iodized salt.   Avoid: Any others, including pepper.  Supplements  Allowed:  Liquid nutrition beverages.   Avoid: Any others that contain lactose or fiber.  SAMPLE MEAL PLAN Breakfast  4 oz (120 mL) strained orange juice.    to 1 cup (125 to 250 mL) gelatin (plain or fortified).   1 cup (250 mL) beverage (coffee or tea).   Sugar, if desired.  Midmorning Snack   cup (125 mL) gelatin (plain or fortified).  Lunch  1 cup (250 mL) broth or consomm.   4 oz (120 mL) strained grapefruit juice.    cup (125 mL) gelatin (plain or fortified).   1 cup (250 mL) beverage (coffee or tea).  Sugar, if desired.  Midafternoon Snack   cup (125 mL) fruit ice.    cup (125 mL) strained fruit juice.  Dinner  1 cup (250 mL) broth or consomm.    cup (125 mL) cranberry juice.    cup (125 mL) flavored gelatin (plain or fortified).   1 cup (250 mL) beverage (coffee or tea).   Sugar, if desired.  Evening Snack  4 oz (120 mL) strained apple juice (vitamin C-fortified).    cup (125 mL) flavored gelatin (plain or fortified).  Document Released: 11/23/2005  Document Revised: 11/12/2011 Document Reviewed: 02/20/2011 [facility] Patient Information 2012 [place], [place].Dizziness Dizziness is a common problem. It is a feeling of unsteadiness or lightheadedness. You may feel like you are about to faint. Dizziness can lead to injury if you stumble or fall. A person of any age group can suffer from dizziness, but dizziness is more common in older adults. CAUSES  Dizziness can be caused by many different things, including:  Middle ear problems.   Standing for too long.   Infections.   An allergic reaction.   Aging.   An emotional response to something, such as the sight of blood.   Side effects of medicines.   Fatigue.   Problems with circulation or blood pressure.   Excess use of alcohol, medicines, or illegal drug use.   Breathing too fast (hyperventilation).   An arrhythmia or problems with your heart rhythm.   Low red blood cell count (anemia).   Pregnancy.   Vomiting, diarrhea, fever, or other illnesses that cause dehydration.   Diseases or conditions such as Parkinson's disease, high blood pressure (hypertension), diabetes, and thyroid problems.   Exposure to extreme heat.  DIAGNOSIS  To find the cause of your dizziness, your caregiver may do a physical exam, lab tests, radiologic imaging scans, or an electrocardiography test (ECG).  TREATMENT  Treatment of dizziness depends on the cause of your symptoms and can vary greatly. HOME CARE INSTRUCTIONS   Drink enough fluids to keep your urine clear or pale yellow. This is especially important in very hot weather. In the elderly, it is also important in cold weather.   If your dizziness is caused by medicines, take them exactly as directed. When taking blood pressure medicines, it is especially important to get up slowly.   Rise slowly from chairs and steady yourself until you feel okay.   In the morning, first sit up on the side of the bed. When this seems okay, stand slowly  while holding onto something until you know your balance is fine.   If you need to stand in one place for a long time, be sure to move your legs often. Tighten and relax the muscles in your legs while standing.   If dizziness continues to be a problem, have someone stay with you for a day or two. Do this until you feel you are well enough to stay alone. Have the person call your caregiver if he or she notices changes in you that are concerning.   Do not drive or use heavy machinery if you feel dizzy.  SEEK IMMEDIATE MEDICAL CARE IF:   Your dizziness or lightheadedness gets worse.   You feel nauseous or vomit.   You develop problems with talking, walking, weakness, or using your arms, hands, or legs.   You are not thinking clearly or you have difficulty forming sentences. It may take a friend or family member to determine if your thinking is normal.  You develop chest pain, abdominal pain, shortness of breath, or sweating.   Your vision changes.   You notice any bleeding.   You have side effects from medicine that seems to be getting worse rather than better.  MAKE SURE YOU:   Understand these instructions.   Will watch your condition.   Will get help right away if you are not doing well or get worse.  Document Released: 05/19/2001 Document Revised: 11/12/2011 Document Reviewed: 06/12/2011 [facility] Patient Information 2012 [place], [place].  RESOURCE GUIDE  Chronic Pain Problems: Contact [facility]  [phone] Patients need to be referred by their primary care doctor.  Insufficient Money for Medicine: Contact United Way:  call "211" or [facility] [phone].  No Primary Care Doctor: - Call [facility]  [phone] - can help you locate a primary care doctor that  accepts your insurance, provides certain services, etc. - Physician Referral Service- [phone]  Agencies that provide inexpensive medical care: - [facility]   [phone] - [facility]  [phone] - [facility]  [phone] - Women's Clinic  [phone] - [facility]  [phone] [facility]  [phone]  Medicaid-accepting [facility] Providers: - [facility]- [address]  [phone], Mon-Fri 9am-7pm, Sat 9am-1pm - [facility]- [address] [place], [address] [place]  [phone] - [facility]- [address], [address] [place]  [phone] [facility]- [address]  [phone] - Renaye Rakers- [address] [place], [address], [phone]  Only accepts [place] patients after they have their name  applied to their card  Self Pay (no insurance) in [place]: - Sickle Cell Patients: Dr Willey Blade, [facility]  [address] [place], [phone] - [facility]- [address] [place]  [phone]       [facility] - [address]       -     [facility]- see information above (Speak to [employer] if you do not have insurance)       -  [facility]- [address] [place], [phone]       -  [facility]- [address],  [phone]       -  [facility]- [address], [phone]       -  Dr Julio Sicks-  [address], [place], [phone]       -  [facility]- [address], [phone]       -  [facility]- [address], [phone], also [address] [phone]       -    [facility]- [address] [place], [phone], 1st & 3rd Saturday   every month, 10am-1pm  1) Find a Doctor and Pay Out of Pocket Although you won't have to find out who is covered by your insurance plan, it is a good idea to ask around and get recommendations. You will then need to call the office and see if the doctor you have chosen will accept you as a new patient and what types of options  they offer for patients who are self-pay. Some doctors offer discounts or will set up payment plans for their patients who do not have  insurance, but you will need to ask so you aren't surprised when you get to your appointment.  2) Contact Your Local Health Department Not all health departments have doctors that can see patients for sick visits, but many do, so it is worth a call to see if yours does. If you don't know where your local health department is, you can check in your phone book. The CDC also has a tool to help you locate your state's health department, and many state websites also have listings of all of their local health departments.  3) Find a Walk-in Clinic If your illness is not likely to be very severe or complicated, you may want to try a walk in clinic. These are popping up all over the country in pharmacies, drugstores, and shopping centers. They're usually staffed by nurse practitioners or physician assistants that have been trained to treat common illnesses and complaints. They're usually fairly quick and inexpensive. However, if you have serious medical issues or chronic medical problems, these are probably not your best option  STD Testing - [facility] Department of [facility], STD Clinic, [address] [place], phone [phone] or [phone].  Monday - Friday, call for an appointment. [facility] of [employer], STD Clinic, [address], [place], phone [phone] or [phone].  Monday - Friday, call for an appointment.  Abuse/Neglect: [facility] Child Abuse Hotline 209 169 9956 [facility] Child Abuse Hotline 437-582-0974 (After Hours)  Emergency Shelter:  [facility] 425-275-3607  Maternity Homes: - [facility] [place] [facility] [phone] - [facility] 8161953544  MRSA Hotline #:   (401)753-5766  [facility] Resources  [facility] of  [place]  United Way [facility] Dept. [address]                 [address]         [address] [place] [address]  Blondell Reveal Phone:  [phone]                                  Phone:  [phone]                   Phone:  5080418283  [facility], [phone] - [facility] - [phone]       -     [facility] in [place], [address],                                  (563)[phone], [facility] Child Abuse Hotline (408)169-2598 or 217-257-7777 (After Hours)   Behavioral Health Services  Substance Abuse Resources: - [facility]  810-061-8162 - [facility] [phone] - [facility] [place] [phone] [facility] [phone] -  Residential & Outpatient Substance Abuse Program  [phone]  Psychological Services: [facility]  [phone] Services  [phone] - [facility], ([phone] [place]. [address] [place], ACCESS LINE: 5735687538 or 5512650425, [employer]  Dental Assistance  If unable to pay or uninsured, contact:  [facility] or [facility]. to become qualified for the adult dental clinic.  Patients with Medicaid: [facility] 606-722-6265 [address], (878)[phone], [phone]  If unable to pay, or uninsured, contact [facility] 740-494-4843) or [facility] Department [phone] in [place], [phone] in [facility]) to become qualified for the adult dental clinic  Other Low-Cost Community Dental Services: - [facility]- [address] [place], [place], [place], [address], [phone], [phone], 2nd and 4th Thursday of the month at 6:30am.  10 clients each day by appointment, can sometimes see  walk-in patients if someone does not show for an appointment. [facility]- [address] [place], [place], [address], [phone] - [facility]- [address] [place], [place], [address], [phone] - [place] [facility]- [phone] [facility] Health Department- [phone] [facility] Department- [phone]

## 2012-06-03 NOTE — ED Notes (Signed)
Patient is unable to get an urine sample will try later

## 2012-06-03 NOTE — ED Provider Notes (Signed)
History     CSN: [id number]  Arrival date & time 06/03/12  0706   First MD Initiated Contact with Patient 06/03/12 [phone]      Chief Complaint  Patient presents with  . Dizziness    (Consider location/radiation/quality/duration/timing/severity/associated sxs/prior treatment) HPI Comments: Samantha Olsen is a 41 y.o. Female who this morning with dizziness. It comes on in waves. She denies vertigo. She feels weak with standing. She feels like her vision is blurred at times. She has nausea, without vomiting. She has a headache that started today. She did not eat or take her morning medications. She has no other alleviating factors. She denies chest pain, shortness of breath, back, pain, change in bowel or urinary habits.  The history is provided by the patient.    Past Medical History  Diagnosis Date  . Hypertension     Past Surgical History  Procedure Date  . Laparoscopy for ectopic pregnancy 01/2011    No family history on file.  History  Substance Use Topics  . Smoking status: Current Everyday Smoker -- 0.5 packs/day  . Smokeless tobacco: Not on file  . Alcohol Use: 0.6 oz/week    1 Cans of beer per week    OB History    Grav Para Term Preterm Abortions TAB SAB Ect Mult Living   1    1   1   0      Review of Systems  All other systems reviewed and are negative.    Allergies  Review of patient's allergies indicates no known allergies.  Home Medications   Current Outpatient Rx  Name Route Sig Dispense Refill  . CALCIUM CARBONATE 600 MG PO TABS Oral Take 600 mg by mouth 2 (two) times daily with a meal.    . VITAMIN D 1000 UNITS PO TABS Oral Take 1,000 Units by mouth daily.    Carma Leaven M PLUS PO TABS Oral Take 1 tablet by mouth daily.      Marland Kitchen VITAMIN C 500 MG PO TABS Oral Take 500 mg by mouth 2 (two) times daily.      Marland Kitchen CRANBERRY CONCENTRATE PO Oral Take 1 tablet by mouth daily.    Marland Kitchen ONDANSETRON HCL 8 MG PO TABS Oral Take 1 tablet (8 mg total) by mouth every 8  (eight) hours as needed for nausea. 20 tablet 0    BP 130/70  Pulse 59  Temp 98.3 F (36.8 C) (Oral)  Resp 16  Ht 5\' 4"  (1.626 m)  Wt 170 lb (77.111 kg)  BMI 29.18 kg/m2  SpO2 100%  LMP 05/11/2012  Physical Exam  Nursing note and vitals reviewed. Constitutional: She is oriented to person, place, and time. She appears well-developed and well-nourished.  HENT:  Head: Normocephalic and atraumatic.  Eyes: Conjunctivae and EOM are normal. Pupils are equal, round, and reactive to light.  Neck: Normal range of motion and phonation normal. Neck supple.  Cardiovascular: Normal rate, regular rhythm and intact distal pulses.   Pulmonary/Chest: Effort normal and breath sounds normal. She exhibits no tenderness.  Abdominal: Soft. She exhibits no distension. There is no tenderness. There is no guarding.  Musculoskeletal: Normal range of motion. She exhibits no edema and no tenderness.  Neurological: She is alert and oriented to person, place, and time. She has normal strength. No cranial nerve deficit. She exhibits normal muscle tone. Coordination normal.       No nystagmus  Skin: Skin is warm and dry.  Psychiatric: Her behavior is normal. Judgment  and thought content normal.       She appears depressed    ED Course  Procedures (including critical care time) Initial blood pressure, 137/97, consistent with, mild diastolic hypertension  Emergency department treatment: IV fluids, and IV Zofran  Reeval: She states that she feels better. Ambulation and oral fluid trials: she tolerated both well   Date: 06/03/2012  Rate: 79  Rhythm: normal sinus rhythm  QRS Axis: normal  Intervals: normal  ST/T Wave abnormalities: normal  Conduction Disutrbances:none  Narrative Interpretation:   Old EKG Reviewed: none available    Labs Reviewed  CBC WITH DIFFERENTIAL  BASIC METABOLIC PANEL  URINALYSIS, ROUTINE W REFLEX MICROSCOPIC  PREGNANCY, URINE   No results found.   1. Dizziness   2.  Nausea       MDM  Nonspecific symptoms, with mild hypertension on arrival. Patient improved, with IV fluids, and Zofran. Etiology is not clear. Doubt metabolic instability, serious bacterial infection or impending vascular collapse; the patient is stable for discharge.  Plan: Home Medications- Zofran; Home Treatments- gradually advance diet; Recommended follow up- see a PCP prn          Flint Melter, MD 06/03/12 1643

## 2013-06-28 ENCOUNTER — Encounter (HOSPITAL_COMMUNITY): Payer: Self-pay | Admitting: Emergency Medicine

## 2013-06-28 ENCOUNTER — Emergency Department (HOSPITAL_COMMUNITY): Payer: No Typology Code available for payment source

## 2013-06-28 ENCOUNTER — Emergency Department (HOSPITAL_COMMUNITY)
Admission: EM | Admit: 2013-06-28 | Discharge: 2013-06-28 | Disposition: A | Discharge status: Home / Self Care | Payer: No Typology Code available for payment source | Attending: Emergency Medicine | Admitting: Emergency Medicine

## 2013-06-28 DIAGNOSIS — IMO0002 Reserved for concepts with insufficient information to code with codable children: Secondary | ICD-10-CM | POA: Insufficient documentation

## 2013-06-28 DIAGNOSIS — S99929A Unspecified injury of unspecified foot, initial encounter: Secondary | ICD-10-CM | POA: Insufficient documentation

## 2013-06-28 DIAGNOSIS — F17200 Nicotine dependence, unspecified, uncomplicated: Secondary | ICD-10-CM | POA: Insufficient documentation

## 2013-06-28 DIAGNOSIS — S8990XA Unspecified injury of unspecified lower leg, initial encounter: Secondary | ICD-10-CM | POA: Insufficient documentation

## 2013-06-28 DIAGNOSIS — S4980XA Other specified injuries of shoulder and upper arm, unspecified arm, initial encounter: Secondary | ICD-10-CM | POA: Insufficient documentation

## 2013-06-28 DIAGNOSIS — R52 Pain, unspecified: Secondary | ICD-10-CM

## 2013-06-28 DIAGNOSIS — W19XXXA Unspecified fall, initial encounter: Secondary | ICD-10-CM | POA: Insufficient documentation

## 2013-06-28 DIAGNOSIS — S46909A Unspecified injury of unspecified muscle, fascia and tendon at shoulder and upper arm level, unspecified arm, initial encounter: Secondary | ICD-10-CM | POA: Insufficient documentation

## 2013-06-28 DIAGNOSIS — I10 Essential (primary) hypertension: Secondary | ICD-10-CM | POA: Insufficient documentation

## 2013-06-28 DIAGNOSIS — S79919A Unspecified injury of unspecified hip, initial encounter: Secondary | ICD-10-CM | POA: Insufficient documentation

## 2013-06-28 DIAGNOSIS — Y939 Activity, unspecified: Secondary | ICD-10-CM | POA: Insufficient documentation

## 2013-06-28 DIAGNOSIS — Y929 Unspecified place or not applicable: Secondary | ICD-10-CM | POA: Insufficient documentation

## 2013-06-28 DIAGNOSIS — IMO0001 Reserved for inherently not codable concepts without codable children: Secondary | ICD-10-CM | POA: Insufficient documentation

## 2013-06-28 MED ORDER — OXYCODONE-ACETAMINOPHEN 5-325 MG PO TABS
2.0000 | ORAL_TABLET | Freq: Once | ORAL | Status: AC
Start: 2013-06-28 — End: 2013-06-28
  Administered 2013-06-28: 2 via ORAL
  Filled 2013-06-28: qty 2

## 2013-06-28 MED ORDER — OXYCODONE-ACETAMINOPHEN 5-325 MG PO TABS: 2.0000 | ORAL_TABLET | Freq: Four times a day (QID) | ORAL | Status: DC | PRN

## 2013-06-28 MED ORDER — PROMETHAZINE HCL 25 MG PO TABS: 25.0000 mg | ORAL_TABLET | Freq: Four times a day (QID) | ORAL | Status: DC | PRN

## 2013-06-28 MED ORDER — ONDANSETRON 4 MG PO TBDP
8.0000 mg | ORAL_TABLET | Freq: Once | ORAL | Status: AC
  Administered 2013-06-28: 8 mg via ORAL
  Filled 2013-06-28: qty 2

## 2013-06-28 NOTE — ED Provider Notes (Signed)
History    CSN: [id number] Arrival date & time 06/28/13  1157  First MD Initiated Contact with Patient 06/28/13 1257     Chief Complaint  Patient presents with  . Fall   (Consider location/radiation/quality/duration/timing/severity/associated sxs/prior Treatment) HPI Comments: Patient is a 42 year old female presents today with right-sided pain after a fall 2 days ago. She states the pain initially was bearable, but has gradually worsened. The pain is achy. She is unsure of what makes her pain worse. She has not tried anything to make her pain better. She has been relaxing for the past few days, but this has not helped. No ibuprofen, acetaminophen, or ice. The pain is throughout her right side, but the worse in her right hip and upper leg. She has been ambulatory. No bowel or bladder incontinence, hx of ca, IVDA, fevers, chills, nausea, vomiting, abdominal pain, weakness, paresthesias. She did not hit her head and there was no LOC in the the fall.    The history is provided by the patient. No language interpreter was used.   Past Medical History  Diagnosis Date  . Hypertension    Past Surgical History  Procedure Laterality Date  . Laparoscopy for ectopic pregnancy  01/2011   History reviewed. No pertinent family history. History  Substance Use Topics  . Smoking status: Current Every Day Smoker -- 0.50 packs/day  . Smokeless tobacco: Not on file  . Alcohol Use: 0.6 oz/week    1 Cans of beer per week   OB History   Grav Para Term Preterm Abortions TAB SAB Ect Mult Living   1    1   1   0     Review of Systems  Constitutional: Negative for fever and chills.  Respiratory: Negative for shortness of breath.   Genitourinary: Negative for dysuria, urgency and difficulty urinating.  Musculoskeletal: Positive for myalgias, back pain, joint swelling, arthralgias and gait problem.  Skin: Negative for rash.  All other systems reviewed and are negative.    Allergies  Review of  patient's allergies indicates no known allergies.  Home Medications   Current Outpatient Rx  Name  Route  Sig  Dispense  Refill  . ibuprofen (ADVIL,MOTRIN) 200 MG tablet   Oral   Take 200 mg by mouth every 6 (six) hours as needed for pain.          BP 145/95  Pulse 88  Temp(Src) 98.1 F (36.7 C) (Oral)  Resp 18  Ht 5\' 4"  (1.626 m)  Wt 185 lb (83.915 kg)  BMI 31.74 kg/m2  SpO2 100% Physical Exam  Nursing note and vitals reviewed. Constitutional: She is oriented to person, place, and time. She appears well-developed and well-nourished. No distress.  HENT:  Head: Normocephalic and atraumatic.  Right Ear: External ear normal.  Left Ear: External ear normal.  Nose: Nose normal.  Mouth/Throat: Oropharynx is clear and moist.  Eyes: Conjunctivae are normal.  Neck: Normal range of motion.  Cardiovascular: Normal rate, regular rhythm, normal heart sounds, intact distal pulses and normal pulses.   Pulmonary/Chest: Effort normal and breath sounds normal. No stridor. No respiratory distress. She has no wheezes. She has no rales.  Abdominal: Soft. She exhibits no distension.  Musculoskeletal: Normal range of motion. She exhibits tenderness.       Right shoulder: She exhibits tenderness and pain. She exhibits no swelling, normal pulse and normal strength.       Right hip: She exhibits tenderness and bony tenderness. She exhibits normal range of  motion, normal strength and no deformity.       Right knee: She exhibits normal range of motion and no swelling. Tenderness found.       Lumbar back: She exhibits tenderness, bony tenderness and pain.       Left upper leg: She exhibits tenderness.  ttp diffusely over right side of body. Strength 5/5 in all extremities. Log roll test positive on right.  Right knee - joint stable. No effusion.   Neurological: She is alert and oriented to person, place, and time. She has normal strength. No sensory deficit. Coordination normal.  Skin: Skin is warm  and dry. She is not diaphoretic. No erythema.  Psychiatric: She has a normal mood and affect. Her behavior is normal.    ED Course  Procedures (including critical care time) Labs Reviewed - No data to display Dg Lumbar Spine Complete  06/28/2013   *RADIOLOGY REPORT*  Clinical Data:  pain post trauma  LUMBAR SPINE - COMPLETE 4+ VIEW  Comparison: None.  Findings:  Frontal, lateral, spot lumbosacral lateral, and bilateral oblique views were obtained.  There are five non-rib bearing lumbar type vertebral bodies.  There is no fracture or spondylolisthesis.  Disc spaces appear intact.  There is no appreciable facet arthropathy.  IMPRESSION: No fracture or appreciable arthropathic change.   Original Report Authenticated By: Bretta Bang, M.D.   Dg Hip Complete Right  06/28/2013   *RADIOLOGY REPORT*  Clinical Data: Pain post trauma  RIGHT HIP - COMPLETE 2+ VIEW  Comparison: None.  Findings:  Frontal pelvis as well as frontal and lateral right hip images were obtained.  No fracture or dislocation.  Joint spaces appear intact.  No erosive change.  IMPRESSION: No abnormality noted.   Original Report Authenticated By: Bretta Bang, M.D.   1. Pain of right side of body     MDM  Patient presents with worsening pain after a fall 2 days ago. Shoulder, hip, back pain on right side. XR negative.  No neurological deficits and normal neuro exam.  Patient can walk but states is painful.  No loss of bowel or bladder control.  No concern for cauda equina.  No fever, night sweats, weight loss, h/o cancer, IVDU.  RICE protocol and pain medicine indicated and discussed with patient.   Medications  oxyCODONE-acetaminophen (PERCOCET/ROXICET) 5-325 MG per tablet 2 tablet (2 tablets Oral Given 06/28/13 1434)  ondansetron (ZOFRAN-ODT) disintegrating tablet 8 mg (8 mg Oral Given 06/28/13 1434)     Mora Bellman, PA-C 06/29/13 1446

## 2013-06-28 NOTE — ED Notes (Signed)
Pt c/o fall 2 days ago c/o back and right side pain

## 2013-06-29 NOTE — ED Provider Notes (Signed)
Medical screening examination/treatment/procedure(s) were performed by non-physician practitioner and as supervising physician I was immediately available for consultation/collaboration.   Clay Solum, MD 06/29/13 1905 

## 2013-11-06 ENCOUNTER — Emergency Department (HOSPITAL_COMMUNITY)
Admission: EM | Admit: 2013-11-06 | Discharge: 2013-11-06 | Disposition: A | Discharge status: Home / Self Care | Payer: Self-pay | Attending: Emergency Medicine | Admitting: Emergency Medicine

## 2013-11-06 ENCOUNTER — Emergency Department (HOSPITAL_COMMUNITY): Payer: Self-pay

## 2013-11-06 ENCOUNTER — Encounter (HOSPITAL_COMMUNITY): Payer: Self-pay | Admitting: Emergency Medicine

## 2013-11-06 DIAGNOSIS — F17200 Nicotine dependence, unspecified, uncomplicated: Secondary | ICD-10-CM | POA: Insufficient documentation

## 2013-11-06 DIAGNOSIS — I10 Essential (primary) hypertension: Secondary | ICD-10-CM | POA: Insufficient documentation

## 2013-11-06 DIAGNOSIS — S82899A Other fracture of unspecified lower leg, initial encounter for closed fracture: Secondary | ICD-10-CM | POA: Insufficient documentation

## 2013-11-06 DIAGNOSIS — Z79899 Other long term (current) drug therapy: Secondary | ICD-10-CM | POA: Insufficient documentation

## 2013-11-06 DIAGNOSIS — X500XXA Overexertion from strenuous movement or load, initial encounter: Secondary | ICD-10-CM | POA: Insufficient documentation

## 2013-11-06 DIAGNOSIS — Y939 Activity, unspecified: Secondary | ICD-10-CM | POA: Insufficient documentation

## 2013-11-06 DIAGNOSIS — Y9289 Other specified places as the place of occurrence of the external cause: Secondary | ICD-10-CM | POA: Insufficient documentation

## 2013-11-06 DIAGNOSIS — S82401A Unspecified fracture of shaft of right fibula, initial encounter for closed fracture: Secondary | ICD-10-CM

## 2013-11-06 MED ORDER — OXYCODONE-ACETAMINOPHEN 5-325 MG PO TABS
2.0000 | ORAL_TABLET | Freq: Once | ORAL | Status: AC
  Administered 2013-11-06: 2 via ORAL
  Filled 2013-11-06: qty 2

## 2013-11-06 MED ORDER — OXYCODONE-ACETAMINOPHEN 5-325 MG PO TABS: 2.0000 | ORAL_TABLET | ORAL | Status: DC | PRN

## 2013-11-06 NOTE — ED Notes (Signed)
GIVEN SALTINES.

## 2013-11-06 NOTE — ED Notes (Signed)
Ortho paged. 

## 2013-11-06 NOTE — ED Provider Notes (Signed)
CSN: [id number]     Arrival date & time 11/06/13  4098 History  This chart was scribed for Irish Elders, NP, working with Leonette Most B. Bernette Mayers, MD, by Ardelia Mems ED Scribe. This patient was seen in room TR06C/TR06C and the patient's care was started at 10:29 AM.   Chief Complaint  Patient presents with  . Ankle Injury    The history is provided by the patient. No language interpreter was used.    HPI Comments: Samantha Olsen is a 42 y.o. female who presents to the Emergency Department complaining of a right ankle injury that occurred last night. She states that she slipped on a wet floor in her kitchen last night, twisted her ankle and heard a "pop". She is complaining of constant moderate right ankle pain with associated swelling onset after the injury. She states that her pain occasionally radiates up her right leg. She received Percocet during this ED visit and states that this offered moderate relief of her pain. She denies sustaining any other injuries last night. She denies numbness or paresthesias or any other symptoms.   Past Medical History  Diagnosis Date  . Hypertension    Past Surgical History  Procedure Laterality Date  . Laparoscopy for ectopic pregnancy  01/2011   No family history on file. History  Substance Use Topics  . Smoking status: Current Every Day Smoker -- 0.50 packs/day  . Smokeless tobacco: Not on file  . Alcohol Use: 0.6 oz/week    1 Cans of beer per week   OB History   Grav Para Term Preterm Abortions TAB SAB Ect Mult Living   1    1   1   0     Review of Systems  Musculoskeletal: Positive for arthralgias (right ankle) and joint swelling (right ankle). Negative for back pain and neck pain.  Neurological: Negative for syncope, numbness and headaches.       Denies paresthesias.  All other systems reviewed and are negative.   Allergies  Review of patient's allergies indicates no known allergies.  Home Medications   Current Outpatient Rx  Name   Route  Sig  Dispense  Refill  . albuterol (PROVENTIL HFA;VENTOLIN HFA) 108 (90 BASE) MCG/ACT inhaler   Inhalation   Inhale 2 puffs into the lungs every 6 (six) hours as needed for wheezing or shortness of breath.         . Multiple Vitamin (MULTIVITAMIN WITH MINERALS) TABS tablet   Oral   Take 1 tablet by mouth daily.         . vitamin C (ASCORBIC ACID) 500 MG tablet   Oral   Take 500 mg by mouth every other day.         . oxyCODONE-acetaminophen (PERCOCET/ROXICET) 5-325 MG per tablet   Oral   Take 2 tablets by mouth every 4 (four) hours as needed.   15 tablet   0    Triage Vitals: BP 157/95  Pulse 88  Temp(Src) 97.9 F (36.6 C) (Oral)  SpO2 98%  Physical Exam  Nursing note and vitals reviewed. Constitutional: She is oriented to person, place, and time. She appears well-developed and well-nourished. No distress.  HENT:  Head: Normocephalic and atraumatic.  Eyes: EOM are normal.  Neck: Neck supple. No tracheal deviation present.  Cardiovascular: Normal rate.   Pulmonary/Chest: Effort normal. No respiratory distress.  Musculoskeletal: She exhibits edema and tenderness.  Moderate edema around the area of the external malleolus extending down into foot. Tender to palpation  on exterior ankle and dorsal foot. Decreased ROM due to edema and pain. Sensation and pulses are intact.  Neurological: She is alert and oriented to person, place, and time.  Skin: Skin is warm and dry.  Psychiatric: She has a normal mood and affect. Her behavior is normal.    ED Course  Procedures (including critical care time)  DIAGNOSTIC STUDIES: Oxygen Saturation is 98% on RA, normal by my interpretation.    COORDINATION OF CARE: 10:33 AM- Discussed clinical that pt may have either a sprain or a fracture. Will await results of pt's X-ray. Pt advised of plan for treatment and pt agrees.  11:08 AM- After seeing fracture in pt's X-ray and consulting the attending provider, will give pt a cam  Nicolo Tomko and crutches. Will also refer for a follow-up with Orthopedics.  Medications  oxyCODONE-acetaminophen (PERCOCET/ROXICET) 5-325 MG per tablet 2 tablet (2 tablets Oral Given 11/06/13 0954)   Labs Review Labs Reviewed - No data to display Imaging Review Dg Ankle Complete Right  11/06/2013   CLINICAL DATA:  Fall, right ankle pain  EXAM: RIGHT ANKLE - COMPLETE 3+ VIEW  COMPARISON:  None.  FINDINGS: Three views of right ankle submitted. There is subtle nondisplaced fracture in distal right fibula with adjacent soft tissue swelling.  IMPRESSION: Subtle nondisplaced fracture distal right fibula with adjacent soft tissue swelling.   Electronically Signed   By: Natasha Mead M.D.   On: 11/06/2013 10:54    EKG Interpretation   None       MDM   1. Fibula fracture, right, closed, initial encounter    Moderate edema, laterally. Non-displaced fx, distal right fibula. Pt placed in Cam Yezenia Fredrick and given crutches. No weight bearing until cleared by Ortho. RICE instructions given and explained. Ortho follow-up info given. No numbness, tingling or neurovascular compromise. Ibuprofen for discomfort and inflammation.  I personally performed the services described in this documentation, which was scribed in my presence. The recorded information has been reviewed and is accurate.    Irish Elders, NP 11/07/13 1116

## 2013-11-06 NOTE — ED Notes (Signed)
TWISTED RIGHT ANKLE LAST PM. NOW HAS PAIN AND SWELLING.

## 2013-11-06 NOTE — ED Notes (Signed)
SPOKE WITH ORTHOTECH.

## 2013-11-06 NOTE — Progress Notes (Signed)
Orthopedic Tech Progress Note Patient Details:  Samantha Olsen 10-Sep-1971 [id number] Cam walker applied to Right LE. Crutches fit for patient height and comfort.  Ortho Devices Type of Ortho Device: CAM walker Ortho Device/Splint Location: Right LE Ortho Device/Splint Interventions: Application   Asia R Thompson 11/06/2013, 11:29 AM

## 2013-11-07 NOTE — ED Provider Notes (Signed)
Medical screening examination/treatment/procedure(s) were performed by non-physician practitioner and as supervising physician I was immediately available for consultation/collaboration.  EKG Interpretation   None         Charles B. Sheldon, MD 11/07/13 1341 

## 2014-08-27 ENCOUNTER — Emergency Department (HOSPITAL_COMMUNITY)
Admission: EM | Admit: 2014-08-27 | Discharge: 2014-08-27 | Disposition: A | Discharge status: Home / Self Care | Payer: No Typology Code available for payment source | Attending: Emergency Medicine | Admitting: Emergency Medicine

## 2014-08-27 ENCOUNTER — Encounter (HOSPITAL_COMMUNITY): Payer: Self-pay | Admitting: Emergency Medicine

## 2014-08-27 ENCOUNTER — Emergency Department (HOSPITAL_COMMUNITY): Payer: No Typology Code available for payment source

## 2014-08-27 DIAGNOSIS — F17200 Nicotine dependence, unspecified, uncomplicated: Secondary | ICD-10-CM | POA: Insufficient documentation

## 2014-08-27 DIAGNOSIS — M7040 Prepatellar bursitis, unspecified knee: Secondary | ICD-10-CM | POA: Insufficient documentation

## 2014-08-27 DIAGNOSIS — M25569 Pain in unspecified knee: Secondary | ICD-10-CM | POA: Insufficient documentation

## 2014-08-27 DIAGNOSIS — Z79899 Other long term (current) drug therapy: Secondary | ICD-10-CM | POA: Insufficient documentation

## 2014-08-27 DIAGNOSIS — M7051 Other bursitis of knee, right knee: Secondary | ICD-10-CM

## 2014-08-27 DIAGNOSIS — I10 Essential (primary) hypertension: Secondary | ICD-10-CM | POA: Insufficient documentation

## 2014-08-27 MED ORDER — HYDROCODONE-ACETAMINOPHEN 5-325 MG PO TABS
1.0000 | ORAL_TABLET | Freq: Once | ORAL | Status: DC
  Filled 2014-08-27: qty 1

## 2014-08-27 MED ORDER — HYDROCODONE-ACETAMINOPHEN 5-325 MG PO TABS: 1.0000 | ORAL_TABLET | Freq: Four times a day (QID) | ORAL | Status: DC | PRN

## 2014-08-27 NOTE — ED Provider Notes (Signed)
Medical screening examination/treatment/procedure(s) were performed by non-physician practitioner and as supervising physician I was immediately available for consultation/collaboration.  Shayne Diguglielmo T Xariah Silvernail, MD 08/27/14 1645 

## 2014-08-27 NOTE — ED Provider Notes (Signed)
CSN: [id number]     Arrival date & time 08/27/14  6213 History   First MD Initiated Contact with Patient 08/27/14 0754     Chief Complaint  Patient presents with  . Knee Pain     (Consider location/radiation/quality/duration/timing/severity/associated sxs/prior Treatment) HPI Samantha Olsen is a 43 y.o. female who presents emergency department complaining of right knee pain. Patient states she injured her knee after a fall approximately 2 months ago. She states she never got any help. The knee was feeling better. States this morning she woke with increased pain in her right anterior knee and swelling. She denies any injuries in the last several days. Yesterday she states she was on her feet all day and did horseplay around with her kids. She denies however any specific injuries. She denies any pain in her knee yesterday. She states pain is mainly to the top of her anterior knee. States no pain with movement of the knee but reports pain with bearing weight and walking. Also states it is tender to the touch. She denies any fever, chills. No other pain. No other complaints. Denies prior similar knee problems. Patient states she took ibuprofen today with no improvement.  Past Medical History  Diagnosis Date  . Hypertension    Past Surgical History  Procedure Laterality Date  . Laparoscopy for ectopic pregnancy  01/2011   No family history on file. History  Substance Use Topics  . Smoking status: Current Every Day Smoker -- 0.50 packs/day    Types: Cigarettes  . Smokeless tobacco: Not on file  . Alcohol Use: 0.6 oz/week    1 Cans of beer per week   OB History   Grav Para Term Preterm Abortions TAB SAB Ect Mult Living   0     Review of Systems  Constitutional: Negative for fever and chills.  Musculoskeletal: Positive for arthralgias and joint swelling.  Neurological: Negative for weakness and numbness.      Allergies  Review of patient's allergies indicates no known  allergies.  Home Medications   Prior to Admission medications   Medication Sig Start Date End Date Taking? Authorizing Provider  albuterol (PROVENTIL HFA;VENTOLIN HFA) 108 (90 BASE) MCG/ACT inhaler Inhale 2 puffs into the lungs every 6 (six) hours as needed for wheezing or shortness of breath.    Historical Provider, MD  Multiple Vitamin (MULTIVITAMIN WITH MINERALS) TABS tablet Take 1 tablet by mouth daily.    Historical Provider, MD  oxyCODONE-acetaminophen (PERCOCET/ROXICET) 5-325 MG per tablet Take 2 tablets by mouth every 4 (four) hours as needed. 11/06/13   Irish Elders, NP  vitamin C (ASCORBIC ACID) 500 MG tablet Take 500 mg by mouth every other day.    Historical Provider, MD   BP 161/100  Pulse 69  Temp(Src) 97.8 F (36.6 C) (Oral)  Resp 18  Ht  (1.626 m)  Wt 189 lb (85.73 kg)  BMI 32.43 kg/m2  SpO2 100%  LMP 08/25/2014 Physical Exam  Nursing note and vitals reviewed. Constitutional: She is oriented to person, place, and time. She appears well-developed and well-nourished. No distress.  Musculoskeletal:  Mild swelling and tenderness to anterior suprapatellar area. Full ROM of the joint. Negative anterior and posterior drawer signs. No laxity with medial or lateral stress. DP pulses intact.   Neurological: She is alert and oriented to person, place, and time.  Skin: Skin is warm and dry.    ED Course  Procedures (including critical  care time) Labs Review Labs Reviewed - No data to display  Imaging Review Dg Knee Complete 4 Views Right  08/27/2014   CLINICAL DATA:  Injury 2 months ago, knee pain and swelling  EXAM: RIGHT KNEE - COMPLETE 4+ VIEW  COMPARISON:  None.  FINDINGS: There is no evidence of fracture, dislocation, or joint effusion. There is no evidence of arthropathy or other focal bone abnormality. Soft tissues are unremarkable.  IMPRESSION: Negative.   Electronically Signed   By: Elige Ko   On: 08/27/2014 09:00     EKG Interpretation None      MDM    Final diagnoses:  Suprapatellar bursitis of right knee   Mild knee joint swelling and tenderness to suprapatellar bursa. No injuries. No signs of infection. Full range of motion of the joint. Neurovascularly intact. Home with a knee sleeve, NSAIDs, followup with primary care Dr. Awanda Mink for severe pain.  Filed Vitals:   08/27/14 0801 08/27/14 0921  BP: 161/100 148/86  Pulse: 69 62  Temp: 97.8 F (36.6 C) 98 F (36.7 C)  TempSrc: Oral   Resp: 18 17  Height:  (1.626 m)   Weight: 189 lb (85.73 kg)   SpO2: 100% 100%     Myriam Jacobson Darran Gabay, PA-C 08/27/14 1206

## 2014-08-27 NOTE — Discharge Instructions (Signed)
Continue ibuprofen. Take norco for severe pain. Keep knee sleeve on for swelling. Ice. Elevate leg. Stay off of it as much as able. Follow up with primary care doctor or orthopedics.   Prepatellar Bursitis with Rehab  Bursitis is a condition that is characterized by inflammation of a bursa. Kateri Mc exists in many areas of the body. They are fluid-filled sacs that lie between a soft tissue (skin, tendon, or ligament) and a bone, and they reduce friction between the structures as well as the stress placed on the soft tissue. Prepatellar bursitis is inflammation of the bursa that lies between the skin and the kneecap (patella). This condition often causes pain over the patella. SYMPTOMS   Pain, tenderness, and/or inflammation over the patella.  Pain that worsens with movement of the knee joint.  Decreased range of motion for the knee joint.  A crackling sound (crepitation) when the bursa is moved or touched.  Occasionally, painless swelling of the bursa.  Fever (when infected). CAUSES  Bursitis is caused by damage to the bursa, which results in an inflammatory response. Common mechanisms of injury include:  Direct trauma to the front of the knee.  Repetitive and/or stressful use of the knee. RISK INCREASES WITH:  Activities in which kneeling and/or falling on one's knees is likely (volleyball or football).  Repetitive and stressful training, especially if it involves running on hills.  Improper training techniques, such as a sudden increase in the intensity, frequency, or duration of training.  Failure to warm up properly before activity.  Poor technique.  Artificial turf. PREVENTION   Avoid kneeling or falling on your knees.  Warm up and stretch properly before activity.  Allow for adequate recovery between workouts.  Maintain physical fitness:  Strength, flexibility, and endurance.  Cardiovascular fitness.  Learn and use proper technique. When possible, have a coach  correct improper technique.  Wear properly fitted and padded protective equipment (kneepads). PROGNOSIS  If treated properly, then the symptoms of prepatellar bursitis usually resolve within 2 weeks. RELATED COMPLICATIONS   Recurrent symptoms that result in a chronic problem.  Prolonged healing time, if improperly treated or reinjured.  Limited range of motion.  Infection of bursa.  Chronic inflammation or scarring of bursa. TREATMENT  Treatment initially involves the use of ice and medication to help reduce pain and inflammation. The use of strengthening and stretching exercises may help reduce pain with activity, especially those of the quadriceps and hamstring muscles. These exercises may be performed at home or with referral to a therapist. Your caregiver may recommend kneepads when you return to playing sports, in order to reduce the stress on the prepatellar bursa. If symptoms persist despite treatment, then your caregiver may drain fluid out with a needle (aspirate) the bursa. If symptoms persist for greater than 6 months despite nonsurgical (conservative) treatment, then surgery may be recommended to remove the bursa.  MEDICATION  If pain medication is necessary, then nonsteroidal anti-inflammatory medications, such as aspirin and ibuprofen, or other minor pain relievers, such as acetaminophen, are often recommended.  Do not take pain medication for 7 days before surgery.  Prescription pain relievers may be given if deemed necessary by your caregiver. Use only as directed and only as much as you need.  Corticosteroid injections may be given by your caregiver. These injections should be reserved for the most serious cases, because they may only be given a certain number of times. HEAT AND COLD  Cold treatment (icing) relieves pain and reduces inflammation. Cold treatment  should be applied for 10 to 15 minutes every 2 to 3 hours for inflammation and pain and immediately after any  activity that aggravates your symptoms. Use ice packs or massage the area with a piece of ice (ice massage).  Heat treatment may be used prior to performing the stretching and strengthening activities prescribed by your caregiver, physical therapist, or athletic trainer. Use a heat pack or soak the injury in warm water. SEEK MEDICAL CARE IF:  Treatment seems to offer no benefit, or the condition worsens.  Any medications produce adverse side effects. EXERCISES RANGE OF MOTION (ROM) AND STRETCHING EXERCISES - Prepatellar Bursitis These exercises may help you when beginning to rehabilitate your injury. Your symptoms may resolve with or without further involvement from your physician, physical therapist or athletic trainer. While completing these exercises, remember:   Restoring tissue flexibility helps normal motion to return to the joints. This allows healthier, less painful movement and activity.  An effective stretch should be held for at least 30 seconds.  A stretch should never be painful. You should only feel a gentle lengthening or release in the stretched tissue. STRETCH - Hamstrings, Standing  Stand or sit and extend your right / left leg, placing your foot on a chair or foot stool  Keeping a slight arch in your low back and your hips straight forward.  Lead with your chest and lean forward at the waist until you feel a gentle stretch in the back of your right / left knee or thigh. (When done correctly, this exercise requires leaning only a small distance.)  Hold this position for __________ seconds. Repeat __________ times. Complete this stretch __________ times per day. STRETCH - Quadriceps, Prone   Lie on your stomach on a firm surface, such as a bed or padded floor.  Bend your right / left knee and grasp your ankle. If you are unable to reach, your ankle or pant leg, use a belt around your foot to lengthen your reach.  Gently pull your heel toward your buttocks. Your knee  should not slide out to the side. You should feel a stretch in the front of your thigh and/or knee.  Hold this position for __________ seconds. Repeat __________ times. Complete this stretch __________ times per day.  STRETCH - Hamstrings/Adductors, V-Sit   Sit on the floor with your legs extended in a large "V," keeping your knees straight.  With your head and chest upright, bend at your waist reaching for your right foot to stretch your left adductors.  You should feel a stretch in your left inner thigh. Hold for __________ seconds.  Return to the upright position to relax your leg muscles.  Continuing to keep your chest upright, bend straight forward at your waist to stretch your hamstrings.  You should feel a stretch behind both of your thighs and/or knees. Hold for __________ seconds.  Return to the upright position to relax your leg muscles.  Repeat steps 2 through 4. Repeat __________ times. Complete this exercise __________ times per day.  STRENGTHENING EXERCISES - Prepatellar Bursitis  These exercises may help you when beginning to rehabilitate your injury. They may resolve your symptoms with or without further involvement from your physician, physical therapist or athletic trainer. While completing these exercises, remember:  Muscles can gain both the endurance and the strength needed for everyday activities through controlled exercises.  Complete these exercises as instructed by your physician, physical therapist or athletic trainer. Progress the resistance and repetitions only as guided.  STRENGTH - Quadriceps, Isometrics  Lie on your back with your right / left leg extended and your opposite knee bent.  Gradually tense the muscles in the front of your right / left thigh. You should see either your kneecap slide up toward your hip or increased dimpling just above the knee. This motion will push the back of the knee down toward the floor/mat/bed on which you are  lying.  Hold the muscle as tight as you can without increasing your pain for __________ seconds.  Relax the muscles slowly and completely in between each repetition. Repeat __________ times. Complete this exercise __________ times per day.  STRENGTH - Quadriceps, Short Arcs   Lie on your back. Place a __________ inch towel roll under your knee so that the knee slightly bends.  Raise only your lower leg by tightening the muscles in the front of your thigh. Do not allow your thigh to rise.  Hold this position for __________ seconds. Repeat __________ times. Complete this exercise __________ times per day.  OPTIONAL ANKLE WEIGHTS: Begin with ____________________, but DO NOT exceed ____________________. Increase in1 lb/0.5 kg increments.  STRENGTH - Quadriceps, Straight Leg Raises  Quality counts! Watch for signs that the quadriceps muscle is working to insure you are strengthening the correct muscles and not "cheating" by substituting with healthier muscles.  Lay on your back with your right / left leg extended and your opposite knee bent.  Tense the muscles in the front of your right / left thigh. You should see either your kneecap slide up or increased dimpling just above the knee. Your thigh may even quiver.  Tighten these muscles even more and raise your leg 4 to 6 inches off the floor. Hold for __________ seconds.  Keeping these muscles tense, lower your leg.  Relax the muscles slowly and completely in between each repetition. Repeat __________ times. Complete this exercise __________ times per day.  STRENGTH - Quadriceps, Step-Ups   Use a thick book, step or step stool that is __________ inches tall.  Holding a wall or counter for balance only, not support.  Slowly step-up with your right / left foot, keeping your knee in line with your hip and foot. Do not allow your knee to bend so far that you cannot see your toes.  Slowly unlock your knee and lower yourself to the starting  position. Your muscles, not gravity, should lower you. Repeat __________ times. Complete this exercise __________ times per day. Document Released: 11/23/2005 Document Revised: 04/09/2014 Document Reviewed: 03/07/2009 [facility]Exit[facility] Patient Information 2015 [place], [place]. This information is not intended to replace advice given to you by your health care provider. Make sure you discuss any questions you have with your health care provider.

## 2014-08-27 NOTE — ED Notes (Signed)
Patient states hurt her R knee a few months back when she fell through a vent.   Patient states hasn't really had any trouble until 3am today.  States she woke up with pain and swelling to R knee.  Patient states has been taking ibuprofen and icing her knee with no relief.

## 2014-10-08 ENCOUNTER — Encounter (HOSPITAL_COMMUNITY): Payer: Self-pay | Admitting: Emergency Medicine

## 2015-07-10 ENCOUNTER — Emergency Department (HOSPITAL_COMMUNITY): Payer: Self-pay

## 2015-07-10 ENCOUNTER — Emergency Department (HOSPITAL_COMMUNITY)
Admission: EM | Admit: 2015-07-10 | Discharge: 2015-07-10 | Disposition: A | Discharge status: Home / Self Care | Payer: Self-pay | Attending: Emergency Medicine | Admitting: Emergency Medicine

## 2015-07-10 ENCOUNTER — Encounter (HOSPITAL_COMMUNITY): Payer: Self-pay | Admitting: Emergency Medicine

## 2015-07-10 DIAGNOSIS — M545 Low back pain, unspecified: Secondary | ICD-10-CM

## 2015-07-10 DIAGNOSIS — I10 Essential (primary) hypertension: Secondary | ICD-10-CM | POA: Insufficient documentation

## 2015-07-10 DIAGNOSIS — Z720 Tobacco use: Secondary | ICD-10-CM | POA: Insufficient documentation

## 2015-07-10 DIAGNOSIS — M25572 Pain in left ankle and joints of left foot: Secondary | ICD-10-CM | POA: Insufficient documentation

## 2015-07-10 DIAGNOSIS — Z79899 Other long term (current) drug therapy: Secondary | ICD-10-CM | POA: Insufficient documentation

## 2015-07-10 MED ORDER — OXYCODONE HCL 5 MG PO TABS
5.0000 mg | ORAL_TABLET | Freq: Once | ORAL | Status: AC
  Administered 2015-07-10: 5 mg via ORAL
  Filled 2015-07-10: qty 1

## 2015-07-10 MED ORDER — HYDROCODONE-ACETAMINOPHEN 5-325 MG PO TABS: 1.0000 | ORAL_TABLET | Freq: Four times a day (QID) | ORAL | Status: DC | PRN

## 2015-07-10 MED ORDER — HYDROCODONE-ACETAMINOPHEN 5-325 MG PO TABS
2.0000 | ORAL_TABLET | Freq: Once | ORAL | Status: AC
  Administered 2015-07-10: 2 via ORAL
  Filled 2015-07-10: qty 2

## 2015-07-10 MED ORDER — LIDOCAINE HCL (PF) 1 % IJ SOLN
5.0000 mL | Freq: Once | INTRAMUSCULAR | Status: AC
  Administered 2015-07-10: 5 mL
  Filled 2015-07-10: qty 5

## 2015-07-10 NOTE — ED Notes (Signed)
Patient C/O left foot pain that began yesterday.  States that it got worse today. When she got up this AM she could not bear weight on her left foot.  States that she thought it was due to her back pain initially.  RN noes swelling on the top of her left foot which is tender to palpation.  Tenderness extends medially ant to the base of her great toe.

## 2015-07-10 NOTE — Discharge Instructions (Signed)
Ankle Pain °Ankle pain is a common symptom. The bones, cartilage, tendons, and muscles of the ankle joint perform a lot of work each day. The ankle joint holds your body weight and allows you to move around. Ankle pain can occur on either side or back of 1 or both ankles. Ankle pain may be sharp and burning or dull and aching. There may be tenderness, stiffness, redness, or warmth around the ankle. The pain occurs more often when a person walks or puts pressure on the ankle. °CAUSES  °There are many reasons ankle pain can develop. It is important to work with your caregiver to identify the cause since many conditions can impact the bones, cartilage, muscles, and tendons. Causes for ankle pain include: °· Injury, including a break (fracture), sprain, or strain often due to a fall, sports, or a high-impact activity. °· Swelling (inflammation) of a tendon (tendonitis). °· Achilles tendon rupture. °· Ankle instability after repeated sprains and strains. °· Poor foot alignment. °· Pressure on a nerve (tarsal tunnel syndrome). °· Arthritis in the ankle or the lining of the ankle. °· Crystal formation in the ankle (gout or pseudogout). °DIAGNOSIS  °A diagnosis is based on your medical history, your symptoms, results of your physical exam, and results of diagnostic tests. Diagnostic tests may include X-ray exams or a computerized magnetic scan (magnetic resonance imaging, MRI). °TREATMENT  °Treatment will depend on the cause of your ankle pain and may include: °· Keeping pressure off the ankle and limiting activities. °· Using crutches or other walking support (a cane or brace). °· Using rest, ice, compression, and elevation. °· Participating in physical therapy or home exercises. °· Wearing shoe inserts or special shoes. °· Losing weight. °· Taking medications to reduce pain or swelling or receiving an injection. °· Undergoing surgery. °HOME CARE INSTRUCTIONS  °· Only take over-the-counter or prescription medicines for  pain, discomfort, or fever as directed by your caregiver. °· Put ice on the injured area. °¨ Put ice in a plastic bag. °¨ Place a towel between your skin and the bag. °¨ Leave the ice on for 15-20 minutes at a time, 03-04 times a day. °· Keep your leg raised (elevated) when possible to lessen swelling. °· Avoid activities that cause ankle pain. °· Follow specific exercises as directed by your caregiver. °· Record how often you have ankle pain, the location of the pain, and what it feels like. This information may be helpful to you and your caregiver. °· Ask your caregiver about returning to work or sports and whether you should drive. °· Follow up with your caregiver for further examination, therapy, or testing as directed. °SEEK MEDICAL CARE IF:  °· Pain or swelling continues or worsens beyond 1 week. °· You have an oral temperature above 102° F (38.9° C). °· You are feeling unwell or have chills. °· You are having an increasingly difficult time with walking. °· You have loss of sensation or other new symptoms. °· You have questions or concerns. °MAKE SURE YOU:  °· Understand these instructions. °· Will watch your condition. °· Will get help right away if you are not doing well or get worse. °Document Released: 05/13/2010 Document Revised: 02/15/2012 Document Reviewed: 05/13/2010 °[employer]® Patient Information ©2015 [employer]. This information is not intended to replace advice given to you by your health care provider. Make sure you discuss any questions you have with your health care provider. ° °Arthralgia °Your caregiver has diagnosed you as suffering from an arthralgia. Arthralgia means   there is pain in a joint. This can come from many reasons including:  Bruising the joint which causes soreness (inflammation) in the joint.  Wear and tear on the joints which occur as we grow older (osteoarthritis).  Overusing the joint.  Various forms of arthritis.  Infections of the joint. Regardless of the  cause of pain in your joint, most of these different pains respond to anti-inflammatory drugs and rest. The exception to this is when a joint is infected, and these cases are treated with antibiotics, if it is a bacterial infection. HOME CARE INSTRUCTIONS   Rest the injured area for as long as directed by your caregiver. Then slowly start using the joint as directed by your caregiver and as the pain allows. Crutches as directed may be useful if the ankles, knees or hips are involved. If the knee was splinted or casted, continue use and care as directed. If an stretchy or elastic wrapping bandage has been applied today, it should be removed and re-applied every 3 to 4 hours. It should not be applied tightly, but firmly enough to keep swelling down. Watch toes and feet for swelling, bluish discoloration, coldness, numbness or excessive pain. If any of these problems (symptoms) occur, remove the ace bandage and re-apply more loosely. If these symptoms persist, contact your caregiver or return to this location.  For the first 24 hours, keep the injured extremity elevated on pillows while lying down.  Apply ice for 15-20 minutes to the sore joint every couple hours while awake for the first half day. Then 03-04 times per day for the first 48 hours. Put the ice in a plastic bag and place a towel between the bag of ice and your skin.  Wear any splinting, casting, elastic bandage applications, or slings as instructed.  Only take over-the-counter or prescription medicines for pain, discomfort, or fever as directed by your caregiver. Do not use aspirin immediately after the injury unless instructed by your physician. Aspirin can cause increased bleeding and bruising of the tissues.  If you were given crutches, continue to use them as instructed and do not resume weight bearing on the sore joint until instructed. Persistent pain and inability to use the sore joint as directed for more than 2 to 3 days are warning  signs indicating that you should see a caregiver for a follow-up visit as soon as possible. Initially, a hairline fracture (break in bone) may not be evident on X-rays. Persistent pain and swelling indicate that further evaluation, non-weight bearing or use of the joint (use of crutches or slings as instructed), or further X-rays are indicated. X-rays may sometimes not show a small fracture until a week or 10 days later. Make a follow-up appointment with your own caregiver or one to whom we have referred you. A radiologist (specialist in reading X-rays) may read your X-rays. Make sure you know how you are to obtain your X-ray results. Do not assume everything is normal if you do not hear from [place]. SEEK MEDICAL CARE IF: Bruising, swelling, or pain increases. SEEK IMMEDIATE MEDICAL CARE IF:   Your fingers or toes are numb or blue.  The pain is not responding to medications and continues to stay the same or get worse.  The pain in your joint becomes severe.  You develop a fever over 102 F (38.9 C).  It becomes impossible to move or use the joint. MAKE SURE YOU:   Understand these instructions.  Will watch your condition.  Will get  help right away if you are not doing well or get worse. Document Released: 11/23/2005 Document Revised: 02/15/2012 Document Reviewed: 07/11/2008 [facility] Patient Information 2015 [place], [place]. This information is not intended to replace advice given to you by your health care provider. Make sure you discuss any questions you have with your health care provider.
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
## 2015-08-14 ENCOUNTER — Encounter (HOSPITAL_COMMUNITY): Payer: Self-pay | Admitting: Emergency Medicine

## 2015-08-14 ENCOUNTER — Emergency Department (INDEPENDENT_AMBULATORY_CARE_PROVIDER_SITE_OTHER)
Admission: EM | Admit: 2015-08-14 | Discharge: 2015-08-14 | Disposition: A | Discharge status: Home / Self Care | Payer: Self-pay | Source: Home / Self Care | Attending: Emergency Medicine | Admitting: Emergency Medicine

## 2015-08-14 DIAGNOSIS — M5442 Lumbago with sciatica, left side: Secondary | ICD-10-CM

## 2015-08-14 MED ORDER — PREDNISONE 50 MG PO TABS: ORAL_TABLET | ORAL | Status: DC

## 2015-08-14 MED ORDER — DICLOFENAC SODIUM 75 MG PO TBEC: 75.0000 mg | DELAYED_RELEASE_TABLET | Freq: Two times a day (BID) | ORAL | Status: DC

## 2015-08-14 MED ORDER — CYCLOBENZAPRINE HCL 10 MG PO TABS: 10.0000 mg | ORAL_TABLET | Freq: Three times a day (TID) | ORAL | Status: DC | PRN

## 2015-08-14 MED ORDER — OXYCODONE-ACETAMINOPHEN 5-325 MG PO TABS: 1.0000 | ORAL_TABLET | ORAL | Status: DC | PRN

## 2015-08-14 NOTE — ED Notes (Signed)
Pt is in extreme pain in her lower back that radiates down the left leg.  Pt was seen in the ED about a month ago for the same problem, but it has gotten progressively worse.  Pt has tried the Vicodin and the Aleve, but nothing takes the pain away.
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
## 2015-08-14 NOTE — Discharge Instructions (Signed)
Your sciatic nerve is getting irritated. Take prednisone daily for 5 days. Use Flexeril 3 times a day for the next 5 days, then as needed. This medicine will make you drowsy. Take diclofenac twice a day for the next 5 days, then as needed. Use Percocet every 4 hours as needed for severe pain. Do not drive taking this medicine. Apply ice to your back frequently. You can apply heat if it feels good. If you are not improving in the next 2 days, please go to the emergency room.

## 2022-04-05 ENCOUNTER — Encounter (HOSPITAL_COMMUNITY): Payer: Self-pay | Admitting: Emergency Medicine

## 2022-04-05 ENCOUNTER — Emergency Department (HOSPITAL_COMMUNITY): Payer: Self-pay

## 2022-04-05 ENCOUNTER — Other Ambulatory Visit: Payer: Self-pay

## 2022-04-05 ENCOUNTER — Emergency Department (HOSPITAL_COMMUNITY)
Admission: EM | Admit: 2022-04-05 | Discharge: 2022-04-05 | Disposition: A | Discharge status: Home / Self Care | Payer: Self-pay | Attending: Emergency Medicine | Admitting: Emergency Medicine

## 2022-04-05 DIAGNOSIS — S01112A Laceration without foreign body of left eyelid and periocular area, initial encounter: Secondary | ICD-10-CM | POA: Insufficient documentation

## 2022-04-05 DIAGNOSIS — Z23 Encounter for immunization: Secondary | ICD-10-CM | POA: Insufficient documentation

## 2022-04-05 DIAGNOSIS — R930 Abnormal findings on diagnostic imaging of skull and head, not elsewhere classified: Secondary | ICD-10-CM | POA: Insufficient documentation

## 2022-04-05 DIAGNOSIS — Y92019 Unspecified place in single-family (private) house as the place of occurrence of the external cause: Secondary | ICD-10-CM | POA: Insufficient documentation

## 2022-04-05 DIAGNOSIS — S0181XA Laceration without foreign body of other part of head, initial encounter: Secondary | ICD-10-CM

## 2022-04-05 DIAGNOSIS — I700 Atherosclerosis of aorta: Secondary | ICD-10-CM | POA: Insufficient documentation

## 2022-04-05 LAB — CBC WITH DIFFERENTIAL/PLATELET
Abs Immature Granulocytes: 0.02 10*3/uL (ref 0.00–0.07)
Basophils Absolute: 0 10*3/uL (ref 0.0–0.1)
Basophils Relative: 1 %
Eosinophils Absolute: 0 10*3/uL (ref 0.0–0.5)
Eosinophils Relative: 0 %
HCT: 35.5 % — ABNORMAL LOW (ref 36.0–46.0)
Hemoglobin: 12.3 g/dL (ref 12.0–15.0)
Immature Granulocytes: 0 %
Lymphocytes Relative: 35 %
Lymphs Abs: 1.7 10*3/uL (ref 0.7–4.0)
MCH: 35.4 pg — ABNORMAL HIGH (ref 26.0–34.0)
MCHC: 34.6 g/dL (ref 30.0–36.0)
MCV: 102.3 fL — ABNORMAL HIGH (ref 80.0–100.0)
Monocytes Absolute: 0.5 10*3/uL (ref 0.1–1.0)
Monocytes Relative: 10 %
Neutro Abs: 2.6 10*3/uL (ref 1.7–7.7)
Neutrophils Relative %: 54 %
Platelets: 169 10*3/uL (ref 150–400)
RBC: 3.47 MIL/uL — ABNORMAL LOW (ref 3.87–5.11)
RDW: 15.6 % — ABNORMAL HIGH (ref 11.5–15.5)
WBC: 4.9 10*3/uL (ref 4.0–10.5)
nRBC: 0 % (ref 0.0–0.2)

## 2022-04-05 LAB — I-STAT CHEM 8, ED
BUN: 12 mg/dL (ref 6–20)
Calcium, Ion: 1.07 mmol/L — ABNORMAL LOW (ref 1.15–1.40)
Chloride: 108 mmol/L (ref 98–111)
Creatinine, Ser: 0.8 mg/dL (ref 0.44–1.00)
Glucose, Bld: 81 mg/dL (ref 70–99)
HCT: 40 % (ref 36.0–46.0)
Hemoglobin: 13.6 g/dL (ref 12.0–15.0)
Potassium: 3.5 mmol/L (ref 3.5–5.1)
Sodium: 144 mmol/L (ref 135–145)
TCO2: 23 mmol/L (ref 22–32)

## 2022-04-05 LAB — I-STAT BETA HCG BLOOD, ED (MC, WL, AP ONLY): I-stat hCG, quantitative: <5 m[IU]/mL (ref <5)

## 2022-04-05 MED ORDER — LIDOCAINE-EPINEPHRINE (PF) 2 %-1:200000 IJ SOLN
20.0000 mL | Freq: Once | INTRAMUSCULAR | Status: AC
  Administered 2022-04-05: 20 mL
  Filled 2022-04-05: qty 20

## 2022-04-05 MED ORDER — BACITRACIN ZINC 500 UNIT/GM EX OINT: TOPICAL_OINTMENT | Freq: Two times a day (BID) | CUTANEOUS | Status: DC

## 2022-04-05 MED ORDER — LIDOCAINE 5 % EX PTCH: 1.0000 | MEDICATED_PATCH | CUTANEOUS | 0 refills | Status: DC

## 2022-04-05 MED ORDER — HALOPERIDOL LACTATE 5 MG/ML IJ SOLN
1.0000 mg | Freq: Once | INTRAMUSCULAR | Status: AC
  Administered 2022-04-05: 1 mg via INTRAVENOUS
  Filled 2022-04-05: qty 1

## 2022-04-05 MED ORDER — IBUPROFEN 800 MG PO TABS: 800.0000 mg | ORAL_TABLET | Freq: Once | ORAL | Status: DC

## 2022-04-05 MED ORDER — TETANUS-DIPHTH-ACELL PERTUSSIS 5-2.5-18.5 LF-MCG/0.5 IM SUSY
0.5000 mL | PREFILLED_SYRINGE | Freq: Once | INTRAMUSCULAR | Status: AC
  Administered 2022-04-05: 0.5 mL via INTRAMUSCULAR
  Filled 2022-04-05: qty 0.5

## 2022-04-05 MED ORDER — ACETAMINOPHEN 500 MG PO TABS: 1000.0000 mg | ORAL_TABLET | Freq: Once | ORAL | Status: DC

## 2022-04-05 MED ORDER — IOHEXOL 350 MG/ML SOLN
100.0000 mL | Freq: Once | INTRAVENOUS | Status: AC | PRN
  Administered 2022-04-05: 100 mL via INTRAVENOUS

## 2022-04-05 NOTE — ED Provider Notes (Signed)
..  Laceration Repair ? ?Date/Time: 04/05/2022 6:58 AM ?Performed by: Jeannie Fend, PA-C ?Authorized by: Jeannie Fend, PA-C  ? ?Consent:  ?  Consent obtained:  Verbal ?  Consent given by:  Patient ?  Risks discussed:  Infection, need for additional repair, pain, poor cosmetic result and poor wound healing ?  Alternatives discussed:  No treatment and delayed treatment ?Universal protocol:  ?  Procedure explained and questions answered to patient or proxy's satisfaction: yes   ?  Relevant documents present and verified: yes   ?  Test results available: yes   ?  Imaging studies available: yes   ?  Required blood products, implants, devices, and special equipment available: yes   ?  Site/side marked: yes   ?  Immediately prior to procedure, a time out was called: yes   ?  Patient identity confirmed:  Verbally with patient ?Anesthesia:  ?  Anesthesia method:  Local infiltration ?  Local anesthetic:  Lidocaine 2% WITH epi ?Laceration details:  ?  Location:  Face ?  Face location:  L eyebrow ?  Length (cm):  1.5 ?  Depth (mm):  4 ?Pre-procedure details:  ?  Preparation:  Patient was prepped and draped in usual sterile fashion ?Exploration:  ?  Hemostasis achieved with:  Epinephrine ?  Imaging outcome: foreign body not noted   ?  Wound exploration: entire depth of wound visualized   ?  Wound extent: no foreign bodies/material noted, no muscle damage noted and no underlying fracture noted   ?Treatment:  ?  Area cleansed with:  Saline ?  Amount of cleaning:  Standard ?  Irrigation solution:  Sterile saline ?Skin repair:  ?  Repair method:  Sutures ?  Suture size:  5-0 ?  Suture material:  Prolene ?  Suture technique:  Simple interrupted ?  Number of sutures:  4 ?Approximation:  ?  Approximation:  Close ?Repair type:  ?  Repair type:  Simple ?Post-procedure details:  ?  Procedure completion:  Tolerated well, no immediate complications ? ?  ?Jeannie Fend, PA-C ?04/05/22 [phone] ? ?  Cy Blamer, MD ?04/05/22 0710 ? ?

## 2022-04-05 NOTE — Discharge Instructions (Addendum)
Please follow-up with your primary care doctor, go to urgent care or return to ER in 5 days for wound evaluation and likely suture removal. ? ?Come back to ER if you develop any redness, pus drainage, or other new concerning symptom. ?
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
## 2022-04-05 NOTE — ED Notes (Signed)
Spoke with provider reference pt complaining of pain. Per provider pt can have 1000mg  of Tylenol until CT scans are done and then we could go from there. Pt is currently refusing the Tylenol states that she doesn't like to take a lot of medicine. ?
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
## 2022-04-05 NOTE — ED Notes (Signed)
Transport here to take  pt to ct scan at this time ?

## 2023-02-01 DIAGNOSIS — Z111 Encounter for screening for respiratory tuberculosis: Secondary | ICD-10-CM | POA: Diagnosis not present

## 2023-02-03 DIAGNOSIS — Z0279 Encounter for issue of other medical certificate: Secondary | ICD-10-CM | POA: Diagnosis not present

## 2023-02-05 ENCOUNTER — Ambulatory Visit (HOSPITAL_COMMUNITY)
Admission: EM | Admit: 2023-02-05 | Discharge: 2023-02-05 | Disposition: A | Discharge status: Home / Self Care | Payer: 59 | Attending: Family Medicine | Admitting: Family Medicine

## 2023-02-05 ENCOUNTER — Encounter (HOSPITAL_COMMUNITY): Payer: Self-pay

## 2023-02-05 ENCOUNTER — Ambulatory Visit (INDEPENDENT_AMBULATORY_CARE_PROVIDER_SITE_OTHER): Payer: 59

## 2023-02-05 VITALS — BP 215/138 | HR 89 | Temp 98.8°F | Resp 18

## 2023-02-05 DIAGNOSIS — I10 Essential (primary) hypertension: Secondary | ICD-10-CM | POA: Diagnosis not present

## 2023-02-05 DIAGNOSIS — M79672 Pain in left foot: Secondary | ICD-10-CM | POA: Diagnosis not present

## 2023-02-05 DIAGNOSIS — M79645 Pain in left finger(s): Secondary | ICD-10-CM | POA: Diagnosis not present

## 2023-02-05 DIAGNOSIS — M79642 Pain in left hand: Secondary | ICD-10-CM

## 2023-02-05 DIAGNOSIS — M7989 Other specified soft tissue disorders: Secondary | ICD-10-CM | POA: Diagnosis not present

## 2023-02-05 DIAGNOSIS — M1A0420 Idiopathic chronic gout, left hand, without tophus (tophi): Secondary | ICD-10-CM | POA: Diagnosis not present

## 2023-02-05 HISTORY — DX: Gout, unspecified: M10.9

## 2023-02-05 MED ORDER — INDOMETHACIN 50 MG PO CAPS: 50.0000 mg | ORAL_CAPSULE | Freq: Two times a day (BID) | ORAL | 0 refills | Status: DC

## 2023-02-05 MED ORDER — METHYLPREDNISOLONE 4 MG PO TBPK: ORAL_TABLET | ORAL | 0 refills | Status: DC

## 2023-02-05 MED ORDER — AMLODIPINE BESYLATE 5 MG PO TABS: 5.0000 mg | ORAL_TABLET | Freq: Every day | ORAL | 0 refills | Status: DC

## 2023-02-05 MED ORDER — COLCHICINE 0.6 MG PO TABS: 0.6000 mg | ORAL_TABLET | Freq: Every day | ORAL | 0 refills | Status: DC

## 2023-02-05 NOTE — ED Provider Notes (Addendum)
[facility]    CSN: [id number] Arrival date & time: 02/05/23  1243      History   Chief Complaint Chief Complaint  Patient presents with   Hand Pain    HPI Samantha Olsen is a 52 y.o. female.   Patient is here for left hand pain.  That started about 3 months ago.  No known injury.  She thinks it is gout as she has had this in the past. No meds taken.   She did not have insurance so she could not see anyone for this.  The left thumb is swollen, warm, pain with light touch.  Pain also at the index finger.  She has not had a formal dx of gout, but was told it was likely gout about 5 years ago.   She is also having pain in the left foot.  Going on for years.  No known injury.  Again, she thinks this is gout as well.        Past Medical History:  Diagnosis Date   Gout    Hypertension     There are no problems to display for this patient.   Past Surgical History:  Procedure Laterality Date   LAPAROSCOPY FOR ECTOPIC PREGNANCY  01/2011    OB History     Gravida  1   Para      Term      Preterm      AB  1   Living  0      SAB      IAB      Ectopic  1   Multiple      Live Births               Home Medications    Prior to Admission medications   Medication Sig Start Date End Date Taking? Authorizing Provider  vitamin C (ASCORBIC ACID) 500 MG tablet Take 500 mg by mouth daily.    Yes [provider]  vitamin E 180 MG (400 UNITS) capsule Take 400 Units by mouth daily.   Yes [provider]  cetirizine (ZYRTEC) 10 MG tablet Take 10 mg by mouth daily as needed for allergies.    [provider]  Cromolyn Sodium (NASAL ALLERGY NA) Place 1 spray into the nose daily as needed (allergies).    [provider]  ibuprofen (ADVIL) 200 MG tablet Take 200 mg by mouth every 6 (six) hours as needed for headache or moderate pain.    [provider]  lidocaine (LIDODERM) 5 % Place 1 patch onto the skin  daily. Remove & Discard patch within 12 hours or as directed by MD 04/05/22   Randal Buba, April, MD    Family History History reviewed. No pertinent family history.  Social History Social History   Tobacco Use   Smoking status: Every Day    Packs/day: 0.50    Types: Cigarettes  Vaping Use   Vaping Use: Never used  Substance Use Topics   Alcohol use: Yes    Alcohol/week: 1.0 standard drink of alcohol    Types: 1 Cans of beer per week    Comment: socially   Drug use: Yes    Frequency: 1.0 times per week    Types: Marijuana     Allergies   Patient has no known allergies.   Review of Systems Review of Systems  Constitutional: Negative.   HENT: Negative.    Respiratory: Negative.    Cardiovascular: Negative.  Gastrointestinal: Negative.   Musculoskeletal:  Positive for arthralgias and joint swelling.  Psychiatric/Behavioral: Negative.       Physical Exam Triage Vital Signs ED Triage Vitals [02/05/23 1259]  Enc Vitals Group     BP (!) 197/104     Pulse Rate 89     Resp 18     Temp 98.8 F (37.1 C)     Temp Source Oral     SpO2 98 %     Weight      Height      Head Circumference      Peak Flow      Pain Score 7     Pain Loc      Pain Edu?      Excl. in [place]?    No data found.  Updated Vital Signs BP (!) 197/104 (BP Location: Right Arm)   Pulse 89   Temp 98.8 F (37.1 C) (Oral)   Resp 18   LMP 08/25/2014   SpO2 98%   Visual Acuity Right Eye Distance:   Left Eye Distance:   Bilateral Distance:    Right Eye Near:   Left Eye Near:    Bilateral Near:     Physical Exam Constitutional:      Appearance: Normal appearance.  Cardiovascular:     Rate and Rhythm: Normal rate.  Pulmonary:     Effort: Pulmonary effort is normal.  Musculoskeletal:     Comments: The left thumb is swollen, warm and very tender, most at the DIP joint;  the index finger is slightly swollen;  the hand is slightly swollen;  unable to bend the thumb or index finger due to  pain/swelling.   The proximal portion of the left foot has a hardened, raised area;  tender;  no redness or warmth noted;   Neurological:     General: No focal deficit present.     Mental Status: She is alert.  Psychiatric:        Mood and Affect: Mood normal.      UC Treatments / Results  Labs (all labs ordered are listed, but only abnormal results are displayed) Labs Reviewed - No data to display  EKG   Radiology DG Foot Complete Left  Result Date: 02/05/2023 CLINICAL DATA:  Left foot pain.  History of gout. EXAM: LEFT FOOT - COMPLETE 3+ VIEW COMPARISON:  None Available. FINDINGS: No acute fracture or dislocation. No bony erosions or periosteal reaction. 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 Osteopenia, significantly advanced for age. Soft tissues are unremarkable. IMPRESSION: 1. 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 2. Age advanced osteopenia. Electronically Signed   By: Titus Dubin M.D.   On: 02/05/2023 13:39   DG Finger Thumb Left  Result Date: 02/05/2023 CLINICAL DATA:  Left hand pain and swelling for the past 3 months. History of gout. EXAM: LEFT THUMB 2+V COMPARISON:  None Available. FINDINGS: There is no evidence of fracture or dislocation. There is no evidence of arthropathy or other focal bone abnormality. Soft tissues are unremarkable. IMPRESSION: Negative. Electronically Signed   By: Titus Dubin M.D.   On: 02/05/2023 13:37    Procedures Procedures (including critical care time)  Medications Ordered in UC Medications - No data to display  Initial Impression / Assessment and Plan / UC Course  I have reviewed the triage vital signs and the nursing notes.  Pertinent labs & imaging results that were available during my care of the patient were reviewed by me and  considered in my medical decision making (see chart for details).  Patient was seen today for pain and swelling, likely gout.  Unclear if ever formally diagnosed with this.  Will  treat as gout today.   Her bp was elevated today.  She states she has a h/o gout but I don't see any medications for this in her history.  Will treat with norvasc '5mg'$  daily at this time.  Please follow up with a pcp.   Final Clinical Impressions(s) / UC Diagnoses   Final diagnoses:  Chronic gout of left hand, unspecified cause     Discharge Instructions      You were seen today for hand and foot pain.  This could be gout. I have sent out several medications to treat this today.  I recommend you make an appointment with a primary care provider for further care for this chronic condition.  You may need to be on medication daily for treatment.  Please go to [email or web address] to find a provider.     ED Prescriptions     Medication Sig Dispense Auth. Provider   methylPREDNISolone (MEDROL DOSEPAK) 4 MG TBPK tablet Take as directed 1 each Shekita Boyden, MD   colchicine 0.6 MG tablet Take 1 tablet (0.6 mg total) by mouth daily. 5 tablet Varnell Donate, MD   indomethacin (INDOCIN) 50 MG capsule Take 1 capsule (50 mg total) by mouth 2 (two) times daily with a meal. 60 capsule Lorisa Scheid, Junie Panning, MD      PDMP not reviewed this encounter.   Rondel Oh, MD 02/05/23 1347    Rondel Oh, MD 02/05/23 1359

## 2023-02-05 NOTE — ED Triage Notes (Signed)
Pt states her left hand has been swelling x 3 months. She states she has gout and this is a flare up.

## 2023-02-05 NOTE — Discharge Instructions (Addendum)
You were seen today for hand and foot pain.  This could be gout. I have sent out several medications to treat this today.  I recommend you make an appointment with a primary care provider for further care for this chronic condition.  You may need to be on medication daily for treatment.  Please go to [email or web address] to find a provider.   Your blood pressure was also elevated today.  This should be follow up on by your primary care provider as well.

## 2023-06-23 ENCOUNTER — Observation Stay (HOSPITAL_COMMUNITY)
Admission: EM | Admit: 2023-06-23 | Discharge: 2023-06-24 | Disposition: A | Discharge status: Home / Self Care | Payer: 59 | Attending: Family Medicine | Admitting: Family Medicine

## 2023-06-23 ENCOUNTER — Other Ambulatory Visit: Payer: Self-pay

## 2023-06-23 ENCOUNTER — Encounter (HOSPITAL_COMMUNITY): Payer: Self-pay

## 2023-06-23 DIAGNOSIS — F17210 Nicotine dependence, cigarettes, uncomplicated: Secondary | ICD-10-CM | POA: Insufficient documentation

## 2023-06-23 DIAGNOSIS — T63481A Toxic effect of venom of other arthropod, accidental (unintentional), initial encounter: Secondary | ICD-10-CM | POA: Diagnosis not present

## 2023-06-23 DIAGNOSIS — D72829 Elevated white blood cell count, unspecified: Secondary | ICD-10-CM | POA: Diagnosis not present

## 2023-06-23 DIAGNOSIS — I10 Essential (primary) hypertension: Secondary | ICD-10-CM | POA: Diagnosis not present

## 2023-06-23 DIAGNOSIS — R069 Unspecified abnormalities of breathing: Secondary | ICD-10-CM | POA: Diagnosis not present

## 2023-06-23 DIAGNOSIS — Z79899 Other long term (current) drug therapy: Secondary | ICD-10-CM | POA: Insufficient documentation

## 2023-06-23 DIAGNOSIS — L509 Urticaria, unspecified: Secondary | ICD-10-CM | POA: Diagnosis not present

## 2023-06-23 DIAGNOSIS — T782XXA Anaphylactic shock, unspecified, initial encounter: Secondary | ICD-10-CM

## 2023-06-23 DIAGNOSIS — F1090 Alcohol use, unspecified, uncomplicated: Secondary | ICD-10-CM | POA: Diagnosis not present

## 2023-06-23 DIAGNOSIS — R000 Tachycardia, unspecified: Secondary | ICD-10-CM | POA: Diagnosis not present

## 2023-06-23 DIAGNOSIS — E876 Hypokalemia: Secondary | ICD-10-CM

## 2023-06-23 DIAGNOSIS — A599 Trichomoniasis, unspecified: Secondary | ICD-10-CM | POA: Diagnosis present

## 2023-06-23 DIAGNOSIS — Z789 Other specified health status: Secondary | ICD-10-CM

## 2023-06-23 DIAGNOSIS — T7840XA Allergy, unspecified, initial encounter: Secondary | ICD-10-CM | POA: Diagnosis not present

## 2023-06-23 LAB — COMPREHENSIVE METABOLIC PANEL
ALT: 20 U/L (ref 0–44)
AST: 56 U/L — ABNORMAL HIGH (ref 15–41)
Albumin: 4.5 g/dL (ref 3.5–5.0)
Alkaline Phosphatase: 77 U/L (ref 38–126)
Anion gap: 25 — ABNORMAL HIGH (ref 5–15)
BUN: 9 mg/dL (ref 6–20)
CO2: 13 mmol/L — ABNORMAL LOW (ref 22–32)
Calcium: 9 mg/dL (ref 8.9–10.3)
Chloride: 99 mmol/L (ref 98–111)
Creatinine, Ser: 1.12 mg/dL — ABNORMAL HIGH (ref 0.44–1.00)
GFR, Estimated: >60 mL/min (ref >60)
Glucose, Bld: 159 mg/dL — ABNORMAL HIGH (ref 70–99)
Potassium: 2.7 mmol/L — CL (ref 3.5–5.1)
Sodium: 137 mmol/L (ref 135–145)
Total Bilirubin: 0.9 mg/dL (ref 0.3–1.2)
Total Protein: 8.6 g/dL — ABNORMAL HIGH (ref 6.5–8.1)

## 2023-06-23 LAB — URINALYSIS, ROUTINE W REFLEX MICROSCOPIC
Bilirubin Urine: NEGATIVE
Glucose, UA: NEGATIVE mg/dL
Ketones, ur: 20 mg/dL — AB
Nitrite: POSITIVE — AB
Protein, ur: 100 mg/dL — AB
Specific Gravity, Urine: 1.013 (ref 1.005–1.030)
WBC, UA: >50 WBC/hpf (ref 0–5)
pH: 5 (ref 5.0–8.0)

## 2023-06-23 LAB — CBC WITH DIFFERENTIAL/PLATELET
Abs Immature Granulocytes: 0.19 10*3/uL — ABNORMAL HIGH (ref 0.00–0.07)
Basophils Absolute: 0.1 10*3/uL (ref 0.0–0.1)
Basophils Relative: 0 %
Eosinophils Absolute: 0 10*3/uL (ref 0.0–0.5)
Eosinophils Relative: 0 %
HCT: 46.6 % — ABNORMAL HIGH (ref 36.0–46.0)
Hemoglobin: 15.9 g/dL — ABNORMAL HIGH (ref 12.0–15.0)
Immature Granulocytes: 1 %
Lymphocytes Relative: 17 %
Lymphs Abs: 3.8 10*3/uL (ref 0.7–4.0)
MCH: 36.1 pg — ABNORMAL HIGH (ref 26.0–34.0)
MCHC: 34.1 g/dL (ref 30.0–36.0)
MCV: 105.7 fL — ABNORMAL HIGH (ref 80.0–100.0)
Monocytes Absolute: 1 10*3/uL (ref 0.1–1.0)
Monocytes Relative: 5 %
Neutro Abs: 17 10*3/uL — ABNORMAL HIGH (ref 1.7–7.7)
Neutrophils Relative %: 77 %
Platelets: 274 10*3/uL (ref 150–400)
RBC: 4.41 MIL/uL (ref 3.87–5.11)
RDW: 14.3 % (ref 11.5–15.5)
WBC: 22.1 10*3/uL — ABNORMAL HIGH (ref 4.0–10.5)
nRBC: 0 % (ref 0.0–0.2)

## 2023-06-23 LAB — BASIC METABOLIC PANEL
Anion gap: 13 (ref 5–15)
BUN: 8 mg/dL (ref 6–20)
CO2: 16 mmol/L — ABNORMAL LOW (ref 22–32)
Calcium: 7.8 mg/dL — ABNORMAL LOW (ref 8.9–10.3)
Chloride: 107 mmol/L (ref 98–111)
Creatinine, Ser: 0.82 mg/dL (ref 0.44–1.00)
GFR, Estimated: >60 mL/min (ref >60)
Glucose, Bld: 78 mg/dL (ref 70–99)
Potassium: 4 mmol/L (ref 3.5–5.1)
Sodium: 136 mmol/L (ref 135–145)

## 2023-06-23 LAB — MAGNESIUM: Magnesium: 1 mg/dL — ABNORMAL LOW (ref 1.7–2.4)

## 2023-06-23 LAB — LACTIC ACID, PLASMA
Lactic Acid, Venous: 2.6 mmol/L (ref 0.5–1.9)
Lactic Acid, Venous: 2.8 mmol/L (ref 0.5–1.9)
Lactic Acid, Venous: 0.9 mmol/L (ref 0.5–1.9)

## 2023-06-23 MED ORDER — MAGNESIUM SULFATE 2 GM/50ML IV SOLN
2.0000 g | Freq: Once | INTRAVENOUS | Status: AC
  Administered 2023-06-23: 2 g via INTRAVENOUS
  Filled 2023-06-23: qty 50

## 2023-06-23 MED ORDER — SODIUM CHLORIDE 0.9 % IV SOLN
1.0000 g | Freq: Once | INTRAVENOUS | Status: AC
  Administered 2023-06-23: 1 g via INTRAVENOUS
  Filled 2023-06-23: qty 10

## 2023-06-23 MED ORDER — DIPHENHYDRAMINE HCL 25 MG PO TABS: 25.0000 mg | ORAL_TABLET | Freq: Four times a day (QID) | ORAL | 0 refills | Status: DC

## 2023-06-23 MED ORDER — ACETAMINOPHEN 650 MG RE SUPP: 650.0000 mg | Freq: Four times a day (QID) | RECTAL | Status: DC | PRN

## 2023-06-23 MED ORDER — ENOXAPARIN SODIUM 40 MG/0.4ML IJ SOSY: 40.0000 mg | PREFILLED_SYRINGE | Freq: Every day | INTRAMUSCULAR | Status: DC

## 2023-06-23 MED ORDER — ONDANSETRON HCL 4 MG/2ML IJ SOLN
4.0000 mg | Freq: Once | INTRAMUSCULAR | Status: AC
  Administered 2023-06-23: 4 mg via INTRAVENOUS
  Filled 2023-06-23: qty 2

## 2023-06-23 MED ORDER — ONDANSETRON HCL 4 MG/2ML IJ SOLN: 4.0000 mg | Freq: Four times a day (QID) | INTRAMUSCULAR | Status: DC | PRN

## 2023-06-23 MED ORDER — MAGNESIUM SULFATE 2 GM/50ML IV SOLN
2.0000 g | Freq: Once | INTRAVENOUS | Status: AC
  Administered 2023-06-24: 2 g via INTRAVENOUS
  Filled 2023-06-23: qty 50

## 2023-06-23 MED ORDER — LACTATED RINGERS IV BOLUS
1000.0000 mL | Freq: Once | INTRAVENOUS | Status: AC
  Administered 2023-06-23: 1000 mL via INTRAVENOUS

## 2023-06-23 MED ORDER — ACETAMINOPHEN 325 MG PO TABS: 650.0000 mg | ORAL_TABLET | Freq: Four times a day (QID) | ORAL | Status: DC | PRN

## 2023-06-23 MED ORDER — AMLODIPINE BESYLATE 5 MG PO TABS
5.0000 mg | ORAL_TABLET | Freq: Every day | ORAL | Status: DC
  Administered 2023-06-23: 5 mg via ORAL
  Filled 2023-06-23: qty 1

## 2023-06-23 MED ORDER — FAMOTIDINE IN NACL 20-0.9 MG/50ML-% IV SOLN
20.0000 mg | Freq: Once | INTRAVENOUS | Status: AC
  Administered 2023-06-23: 20 mg via INTRAVENOUS
  Filled 2023-06-23: qty 50

## 2023-06-23 MED ORDER — EPINEPHRINE 0.3 MG/0.3ML IJ SOAJ: 0.3000 mg | INTRAMUSCULAR | 0 refills | Status: AC | PRN

## 2023-06-23 MED ORDER — PREDNISONE 10 MG PO TABS: 40.0000 mg | ORAL_TABLET | Freq: Every day | ORAL | 0 refills | Status: AC

## 2023-06-23 MED ORDER — FAMOTIDINE 20 MG PO TABS: 20.0000 mg | ORAL_TABLET | Freq: Two times a day (BID) | ORAL | 0 refills | Status: DC

## 2023-06-23 MED ORDER — ONDANSETRON HCL 4 MG PO TABS: 4.0000 mg | ORAL_TABLET | Freq: Four times a day (QID) | ORAL | Status: DC | PRN

## 2023-06-23 MED ORDER — POTASSIUM CHLORIDE CRYS ER 20 MEQ PO TBCR
40.0000 meq | EXTENDED_RELEASE_TABLET | Freq: Once | ORAL | Status: AC
  Administered 2023-06-23: 40 meq via ORAL
  Filled 2023-06-23: qty 2

## 2023-06-23 MED ORDER — SODIUM CHLORIDE 0.9 % IV BOLUS
1000.0000 mL | Freq: Once | INTRAVENOUS | Status: AC
  Administered 2023-06-23: 1000 mL via INTRAVENOUS

## 2023-06-23 MED ORDER — POTASSIUM CHLORIDE 10 MEQ/100ML IV SOLN
10.0000 meq | INTRAVENOUS | Status: AC
  Administered 2023-06-23 (×4): 10 meq via INTRAVENOUS
  Filled 2023-06-23 (×3): qty 100

## 2023-06-23 NOTE — Assessment & Plan Note (Signed)
Last drink was wine last PM per pt. Check EtOH level (want to be <10 before we start treatment with metronidazole for trichomonas).

## 2023-06-23 NOTE — ED Provider Notes (Signed)
  Physical Exam  BP (!) 160/105   Pulse 100   Temp 98 F (36.7 C)   Resp 20   Ht 5\' 4"  (1.626 m)   Wt 68 kg   LMP 08/25/2014   SpO2 100%   BMI 25.75 kg/m   Physical Exam Vitals and nursing note reviewed.  Constitutional:      General: She is not in acute distress.    Appearance: She is well-developed.  HENT:     Head: Normocephalic and atraumatic.  Eyes:     Conjunctiva/sclera: Conjunctivae normal.  Cardiovascular:     Rate and Rhythm: Normal rate and regular rhythm.     Heart sounds: No murmur heard. Pulmonary:     Effort: Pulmonary effort is normal. No respiratory distress.     Breath sounds: Normal breath sounds.  Abdominal:     Palpations: Abdomen is soft.     Tenderness: There is no abdominal tenderness. There is no right CVA tenderness or left CVA tenderness.  Musculoskeletal:        General: No swelling.     Cervical back: Neck supple.  Skin:    General: Skin is warm and dry.     Capillary Refill: Capillary refill takes less than 2 seconds.  Neurological:     Mental Status: She is alert.  Psychiatric:        Mood and Affect: Mood normal.     Procedures  Procedures  ED Course / MDM   Clinical Course as of 06/23/23 1743  Wed Jun 23, 2023  1704 He with severe anaphylaxis, AKI, hypokalemia.  Repeat labs possible discharge. [JD]    Clinical Course User Index [JD] Laurence Spates, MD   Medical Decision Making Amount and/or Complexity of Data Reviewed Labs: ordered.  Risk OTC drugs. Prescription drug management. Decision regarding hospitalization.   Briefly, patient presents with anaphylaxis after insect bite.  She received multiple doses of epinephrine, show lab work notable for lactic acidosis and severe hypokalemia.  On reevaluation, she is feeling significantly better.  She reports she has had some nausea and vomiting for the last few days and earlier had some dysuria.  Urinalysis appears possibly contaminated but is concerning for infection.   Will treat with course of Rocephin.  Her magnesium is significantly low as well and she has significant leukocytosis likely reactive in setting of anaphylaxis.  Repeat lab work shows normalization of potassium although persistent non-anion gap metabolic acidosis as well as lactic acidosis slightly increased.  Will p.o. challenge and reevaluate.  Patient reevaluated, she still not feeling well and not tolerating p.o. particular well.  She endorses some recent urinary symptoms, and appears to have possible UTI.  Will treat with Rocephin.  Given her persistent lactic acidosis, low bicarb, leukocytosis, and electrolyte abnormalities will plan for observation with hospitalist overnight.  Discussed with hospitalist and admitted.     Laurence Spates, MD 06/23/23 [phone]

## 2023-06-23 NOTE — Assessment & Plan Note (Addendum)
Replacing with 4gm mag sulfate Repeat Mg in AM Tele monitor overnight

## 2023-06-23 NOTE — ED Provider Notes (Signed)
[place] [facility] DEPARTMENT AT [facility] Provider Note   CSN: [id number] Arrival date & time: 06/23/23  1016     History  Chief Complaint  Patient presents with   Allergic Reaction    Samantha Olsen is a 52 y.o. female.  HPI Patient presents for allergic reaction.  Medical history includes gout and hypertension.  She denies any history of severe allergic reaction in the past.  Earlier today, patient was in her normal state of health.  While outside, she felt something bite her on the right hand.  She did not see what it was.  Shortly thereafter, she developed diffuse hives.  She went inside the house.  She experienced tongue swelling, shortness of breath, and nausea.  She had a syncopal episode.  She was in and out of consciousness prior to EMS arrival.  She had vomiting.  EMS gave 2 intramuscular shots of epinephrine, 50 mg of IV Benadryl, and 125 mg of IV Solu-Medrol prior to arrival.  She had improvement in symptoms.  Blood pressure was normal.  Currently, patient endorses fatigue.     Home Medications Prior to Admission medications   Medication Sig Start Date End Date Taking? Authorizing Provider  amLODipine (NORVASC) 5 MG tablet Take 1 tablet (5 mg total) by mouth daily. 02/05/23   Piontek, Denny Peon, MD  cetirizine (ZYRTEC) 10 MG tablet Take 10 mg by mouth daily as needed for allergies.    [provider]  colchicine 0.6 MG tablet Take 1 tablet (0.6 mg total) by mouth daily. 02/05/23   Piontek, Denny Peon, MD  Cromolyn Sodium (NASAL ALLERGY NA) Place 1 spray into the nose daily as needed (allergies).    [provider]  ibuprofen (ADVIL) 200 MG tablet Take 200 mg by mouth every 6 (six) hours as needed for headache or moderate pain.    [provider]  indomethacin (INDOCIN) 50 MG capsule Take 1 capsule (50 mg total) by mouth 2 (two) times daily with a meal. 02/05/23   Piontek, Erin, MD  lidocaine (LIDODERM) 5 % Place 1 patch onto the skin daily. Remove &  Discard patch within 12 hours or as directed by MD 04/05/22   Nicanor Alcon, April, MD  methylPREDNISolone (MEDROL DOSEPAK) 4 MG TBPK tablet Take as directed 02/05/23   Jannifer Franklin, MD  vitamin C (ASCORBIC ACID) 500 MG tablet Take 500 mg by mouth daily.     [provider]  vitamin E 180 MG (400 UNITS) capsule Take 400 Units by mouth daily.    [provider]      Allergies    Patient has no known allergies.    Review of Systems   Review of Systems  Constitutional:  Positive for fatigue.  Respiratory:  Positive for shortness of breath.   Gastrointestinal:  Positive for nausea and vomiting.  Skin:  Positive for rash.  Neurological:  Positive for syncope.  All other systems reviewed and are negative.   Physical Exam Updated Vital Signs BP (!) 160/105   Pulse 100   Temp 98 F (36.7 C)   Resp 20   Ht 5\' 4"  (1.626 m)   Wt 68 kg   LMP 08/25/2014   SpO2 100%   BMI 25.75 kg/m  Physical Exam Vitals and nursing note reviewed.  Constitutional:      General: She is not in acute distress.    Appearance: Normal appearance. She is well-developed. She is not ill-appearing, toxic-appearing or diaphoretic.  HENT:  Head: Normocephalic and atraumatic.     Right Ear: External ear normal.     Left Ear: External ear normal.     Nose: Nose normal.     Mouth/Throat:     Mouth: Mucous membranes are moist.  Eyes:     Extraocular Movements: Extraocular movements intact.     Conjunctiva/sclera: Conjunctivae normal.  Cardiovascular:     Rate and Rhythm: Normal rate and regular rhythm.     Heart sounds: No murmur heard. Pulmonary:     Effort: Pulmonary effort is normal. No respiratory distress.     Breath sounds: Normal breath sounds. No wheezing or rales.  Abdominal:     General: Abdomen is flat. There is no distension.     Palpations: Abdomen is soft.     Tenderness: There is no abdominal tenderness.  Musculoskeletal:        General: No swelling.     Cervical back: Normal  range of motion and neck supple.     Right lower leg: No edema.     Left lower leg: No edema.  Skin:    General: Skin is warm and dry.     Findings: Rash (Urticarial rash on chest) present.  Neurological:     General: No focal deficit present.     Mental Status: She is alert and oriented to person, place, and time.  Psychiatric:        Mood and Affect: Mood normal.        Behavior: Behavior normal.     ED Results / Procedures / Treatments   Labs (all labs ordered are listed, but only abnormal results are displayed) Labs Reviewed  CBC WITH DIFFERENTIAL/PLATELET - Abnormal; Notable for the following components:      Result Value   WBC 22.1 (*)    Hemoglobin 15.9 (*)    HCT 46.6 (*)    MCV 105.7 (*)    MCH 36.1 (*)    Neutro Abs 17.0 (*)    Abs Immature Granulocytes 0.19 (*)    All other components within normal limits  COMPREHENSIVE METABOLIC PANEL - Abnormal; Notable for the following components:   Potassium 2.7 (*)    CO2 13 (*)    Glucose, Bld 159 (*)    Creatinine, Ser 1.12 (*)    Total Protein 8.6 (*)    AST 56 (*)    Anion gap 25 (*)    All other components within normal limits  LACTIC ACID, PLASMA - Abnormal; Notable for the following components:   Lactic Acid, Venous 2.6 (*)    All other components within normal limits  MAGNESIUM  URINALYSIS, ROUTINE W REFLEX MICROSCOPIC  BASIC METABOLIC PANEL  LACTIC ACID, PLASMA    EKG None  Radiology No results found.  Procedures Procedures    Medications Ordered in ED Medications  potassium chloride 10 mEq in 100 mL IVPB (0 mEq Intravenous Stopped 06/23/23 1651)  lactated ringers bolus 1,000 mL (0 mLs Intravenous Stopped 06/23/23 1340)  famotidine (PEPCID) IVPB 20 mg premix (0 mg Intravenous Stopped 06/23/23 1340)  ondansetron (ZOFRAN) injection 4 mg (4 mg Intravenous Given 06/23/23 1240)  potassium chloride SA (KLOR-CON M) CR tablet 40 mEq (40 mEq Oral Given 06/23/23 1344)  sodium chloride 0.9 % bolus 1,000 mL (0  mLs Intravenous Stopped 06/23/23 1535)    ED Course/ Medical Decision Making/ A&P  Medical Decision Making Amount and/or Complexity of Data Reviewed Labs: ordered.  Risk Prescription drug management.   This patient presents to the ED for concern of allergic reaction, this involves an extensive number of treatment options, and is a complaint that carries with it a high risk of complications and morbidity.  The differential diagnosis includes allergic reaction, anaphylaxis, ingestion, metabolic derangements   Co morbidities that complicate the patient evaluation  Gout, hypertension   Additional history obtained:  Additional history obtained from EMS, patient's family External records from outside source obtained and reviewed including EMR   Lab Tests:  I Ordered, and personally interpreted labs.  The pertinent results include: Large leukocytosis, arthrocentesis consistent with hemoconcentration, hypokalemia, anion gap metabolic acidosis, increased creatinine from baseline  Cardiac Monitoring: / EKG:  The patient was maintained on a cardiac monitor.  I personally viewed and interpreted the cardiac monitored which showed an underlying rhythm of: Sinus rhythm  Problem List / ED Course / Critical interventions / Medication management  Patient presents after an allergic reaction.  This is described by witness as diffuse hives, tongue swelling, shortness of breath, nausea, vomiting, and syncope.  She received 2 intramuscular shots of epinephrine, 50 mg of Benadryl, and 125 mg of Solu-Medrol prior to arrival.  She had improved symptoms at time of arrival in the ED.  She remained fatigued and somnolent.  There is a small area of urticaria on her chest.  After short time in the ED, this also resolved.  Patient's lungs are clear to auscultation with no evidence of wheezing.  She is mildly tachycardic with otherwise normal vital signs.  Patient was observed in the  ED.  She was given IV fluids and Pepcid.  Lab work showed leukocytosis and erythrocytosis consistent with hemoconcentration.  CMP showed significant anion gap metabolic acidosis and hypokalemia.  Replacing potassium was ordered.  Additional IV fluids were ordered.  Patient had continued improved symptoms while in the ED.  Repeat lab work was ordered to ensure resolution of metabolic abnormalities.  Care of patient was signed out to oncoming ED provider. I ordered medication including IV fluids for hydration; Pepcid for antihistamine effect, Zofran for nausea, potassium chloride for hypokalemia Reevaluation of the patient after these medicines showed that the patient improved I have reviewed the patients home medicines and have made adjustments as needed   Social Determinants of Health:  Does not have PCP   CRITICAL CARE Performed by: Gloris Manchester   Total critical care time: 35 minutes  Critical care time was exclusive of separately billable procedures and treating other patients.  Critical care was necessary to treat or prevent imminent or life-threatening deterioration.  Critical care was time spent personally by me on the following activities: development of treatment plan with patient and/or surrogate as well as nursing, discussions with consultants, evaluation of patient's response to treatment, examination of patient, obtaining history from patient or surrogate, ordering and performing treatments and interventions, ordering and review of laboratory studies, ordering and review of radiographic studies, pulse oximetry and re-evaluation of patient's condition.           Final Clinical Impression(s) / ED Diagnoses Final diagnoses:  Anaphylaxis, initial encounter  Hypokalemia    Rx / DC Orders ED Discharge Orders     None         Gloris Manchester, MD 06/23/23 1701

## 2023-06-23 NOTE — Assessment & Plan Note (Addendum)
Pt in to ED with initial anaphylactic reaction to some sort of insect bite / sting.  H/o same previously per pt, didn't see what bit / stung her though was outside and previous time was during summer as well (sounds suspicious for bee sting of some sort). Resolved at this time Now HYPER tensive slightly needing amlodipine in ED Needs epipen on discharge Needs referral to allergist for testing / desensitization on discharge.

## 2023-06-23 NOTE — ED Triage Notes (Signed)
Pt bib [facility] from home where she was bit by something on her left arm. Pt started to have hives and n/v and was shob on ems arrival. Pt received 2 epi IM, 50mg  benadryl, 125mg  solumedrol en route. AOx4, Ambulatory, Resp e/u

## 2023-06-23 NOTE — ED Notes (Signed)
MD made aware of K+ 2.7. Will continue to monitor

## 2023-06-23 NOTE — Assessment & Plan Note (Signed)
UTI vs demargination in setting of getting steroids. UCx pending Got dose of rocephin in ED Repeat CBC in AM

## 2023-06-23 NOTE — H&P (Signed)
History and Physical    Patient: Samantha Olsen:[id number] DOB: Mar 15, 1971 DOA: 06/23/2023 DOS: the patient was seen and examined on 06/24/2023 PCP: Patient, No Pcp Per  Patient coming from: Home  Chief Complaint:  Chief Complaint  Patient presents with   Allergic Reaction   HPI: Samantha Olsen is a 52 y.o. female with medical history significant of HTN.  Pt with h/o allergic rxn to insect sting once in past, was summer time, not sure what type of insect.  Earlier today, in normal state of health.  While outside, felt something bite her on the right hand.  She did not see what it was.  Shortly thereafter, she developed diffuse hives.  She went inside the house.  She experienced tongue swelling, shortness of breath, and nausea.  She had a syncopal episode.  She was in and out of consciousness prior to EMS arrival.  She had vomiting.  EMS gave 2 intramuscular shots of epinephrine, 50 mg of IV Benadryl, and 125 mg of IV Solu-Medrol prior to arrival.  She had improvement in symptoms.  Blood pressure was normal.  Currently, patient endorses fatigue.   Pt in to ED around 10am.  Pt significantly improved since then / allergic rxn seems to have resolved with resolution of hives.  Now HYPERtensive in ED and got evening dose of amlodipine 5mg .   Review of Systems: As mentioned in the history of present illness. All other systems reviewed and are negative. Past Medical History:  Diagnosis Date   Gout    Hypertension    Past Surgical History:  Procedure Laterality Date   LAPAROSCOPY FOR ECTOPIC PREGNANCY  01/2011   Social History:  reports that she has been smoking cigarettes. She does not have any smokeless tobacco history on file. She reports current alcohol use of about 1.0 standard drink of alcohol per week. She reports current drug use. Frequency: 1.00 time per week. Drug: Marijuana.  No Known Allergies  History reviewed. No pertinent family history.  Prior to Admission  medications   Medication Sig Start Date End Date Taking? Authorizing Provider  amLODipine (NORVASC) 5 MG tablet Take 1 tablet (5 mg total) by mouth daily. 02/05/23  Yes Piontek, Erin, MD  diphenhydrAMINE (BENADRYL) 25 MG tablet Take 1 tablet (25 mg total) by mouth every 6 (six) hours for 3 days. 06/23/23 06/26/23 Yes Gloris Manchester, MD  EPINEPHrine 0.3 mg/0.3 mL IJ SOAJ injection Inject 0.3 mg into the muscle as needed for anaphylaxis. In the event of a severe allergic reaction, inject into muscle and call 911. 06/23/23  Yes Gloris Manchester, MD  famotidine (PEPCID) 20 MG tablet Take 1 tablet (20 mg total) by mouth 2 (two) times daily for 3 days. 06/23/23 06/26/23 Yes Gloris Manchester, MD  indomethacin (INDOCIN) 50 MG capsule Take 1 capsule (50 mg total) by mouth 2 (two) times daily with a meal. Patient taking differently: Take 50 mg by mouth 2 (two) times daily. 02/05/23  Yes Piontek, Denny Peon, MD  methylPREDNISolone (MEDROL DOSEPAK) 4 MG TBPK tablet Take as directed 02/05/23  Yes Piontek, Erin, MD  predniSONE (DELTASONE) 10 MG tablet Take 4 tablets (40 mg total) by mouth daily for 3 days. 06/23/23 06/26/23 Yes Gloris Manchester, MD    Physical Exam: Vitals:   06/23/23 2241 06/23/23 2245 06/23/23 2315 06/24/23 0000  BP:  134/80 (!) 153/95 139/79  Pulse:  81 85 84  Resp:  (!) 25 (!) 26 (!) 22  Temp: 97.8 F (36.6 C)  TempSrc:      SpO2:  94% 99% 100%  Weight:      Height:       Constitutional: NAD, calm, comfortable Respiratory: clear to auscultation bilaterally, no wheezing, no crackles. Normal respiratory effort. No accessory muscle use.  Cardiovascular: Regular rate and rhythm, no murmurs / rubs / gallops. No extremity edema. 2+ pedal pulses. No carotid bruits.  Abdomen: no tenderness, no masses palpated. No hepatosplenomegaly. Bowel sounds positive.  Neurologic: CN 2-12 grossly intact. Sensation intact, DTR normal. Strength 5/5 in all 4.  Psychiatric: Normal judgment and insight. Alert and oriented x 3. Normal  mood.   Data Reviewed:    Labs on Admission: I have personally reviewed following labs and imaging studies  CBC: Recent Labs  Lab 06/23/23 1146  WBC 22.1*  NEUTROABS 17.0*  HGB 15.9*  HCT 46.6*  MCV 105.7*  PLT 274   Basic Metabolic Panel: Recent Labs  Lab 06/23/23 1146 06/23/23 1603  NA 137 136  K 2.7* 4.0  CL 99 107  CO2 13* 16*  GLUCOSE 159* 78  BUN 9 8  CREATININE 1.12* 0.82  CALCIUM 9.0 7.8*  MG  --  1.0*   GFR: Estimated Creatinine Clearance: 78.6 mL/min (by C-G formula based on SCr of 0.82 mg/dL). Liver Function Tests: Recent Labs  Lab 06/23/23 1146  AST 56*  ALT 20  ALKPHOS 77  BILITOT 0.9  PROT 8.6*  ALBUMIN 4.5   No results for input(s): "LIPASE", "AMYLASE" in the last 168 hours. No results for input(s): "AMMONIA" in the last 168 hours. Coagulation Profile: No results for input(s): "INR", "PROTIME" in the last 168 hours. Cardiac Enzymes: No results for input(s): "CKTOTAL", "CKMB", "CKMBINDEX", "TROPONINI" in the last 168 hours. BNP (last 3 results) No results for input(s): "PROBNP" in the last 8760 hours. HbA1C: No results for input(s): "HGBA1C" in the last 72 hours. CBG: No results for input(s): "GLUCAP" in the last 168 hours. Lipid Profile: No results for input(s): "CHOL", "HDL", "LDLCALC", "TRIG", "CHOLHDL", "LDLDIRECT" in the last 72 hours. Thyroid Function Tests: No results for input(s): "TSH", "T4TOTAL", "FREET4", "T3FREE", "THYROIDAB" in the last 72 hours. Anemia Panel: No results for input(s): "VITAMINB12", "FOLATE", "FERRITIN", "TIBC", "IRON", "RETICCTPCT" in the last 72 hours. Urine analysis:    Component Value Date/Time   COLORURINE AMBER (A) 06/23/2023 1650   APPEARANCEUR CLOUDY (A) 06/23/2023 1650   LABSPEC 1.013 06/23/2023 1650   PHURINE 5.0 06/23/2023 1650   GLUCOSEU NEGATIVE 06/23/2023 1650   HGBUR SMALL (A) 06/23/2023 1650   BILIRUBINUR NEGATIVE 06/23/2023 1650   KETONESUR 20 (A) 06/23/2023 1650   PROTEINUR 100 (A)  06/23/2023 1650   UROBILINOGEN 0.2 06/03/2012 0946   NITRITE POSITIVE (A) 06/23/2023 1650   LEUKOCYTESUR LARGE (A) 06/23/2023 1650    Radiological Exams on Admission: No results found.  EKG: Independently reviewed.   Assessment and Plan: * Hypomagnesemia Replacing with 4gm mag sulfate Repeat Mg in AM Tele monitor overnight  Trichomonas infection Checking EtOH level first before ordering metronidazole given reported h/o last drink being last evening. Plan = order metronidazole for treatment once EtOH comes back < 10  Leukocytosis UTI vs demargination in setting of getting steroids. UCx pending Got dose of rocephin in ED Repeat CBC in AM  Alcohol use Last drink was wine last PM per pt. Check EtOH level (want to be <10 before we start treatment with metronidazole for trichomonas).  Anaphylaxis due to insect venom Pt in to ED with initial anaphylactic reaction to some  sort of insect bite / sting.  H/o same previously per pt, didn't see what bit / stung her though was outside and previous time was during summer as well (sounds suspicious for bee sting of some sort). Resolved at this time Now HYPER tensive slightly needing amlodipine in ED Needs epipen on discharge Needs referral to allergist for testing / desensitization on discharge.      Advance Care Planning:   Code Status: Full Code  Consults: None  Family Communication: No family in room  Severity of Illness: The appropriate patient status for this patient is OBSERVATION. Observation status is judged to be reasonable and necessary in order to provide the required intensity of service to ensure the patient's safety. The patient's presenting symptoms, physical exam findings, and initial radiographic and laboratory data in the context of their medical condition is felt to place them at decreased risk for further clinical deterioration. Furthermore, it is anticipated that the patient will be medically stable for discharge  from the hospital within 2 midnights of admission.   Author: Hillary Bow., DO 06/24/2023 12:12 AM  For on call review [email or web address].

## 2023-06-23 NOTE — Assessment & Plan Note (Signed)
Checking EtOH level first before ordering metronidazole given reported h/o last drink being last evening. Plan = order metronidazole for treatment once EtOH comes back < 10

## 2023-06-24 LAB — CBC
HCT: 32.5 % — ABNORMAL LOW (ref 36.0–46.0)
Hemoglobin: 11.2 g/dL — ABNORMAL LOW (ref 12.0–15.0)
MCH: 36.1 pg — ABNORMAL HIGH (ref 26.0–34.0)
MCHC: 34.5 g/dL (ref 30.0–36.0)
MCV: 104.8 fL — ABNORMAL HIGH (ref 80.0–100.0)
Platelets: 173 10*3/uL (ref 150–400)
RBC: 3.1 MIL/uL — ABNORMAL LOW (ref 3.87–5.11)
RDW: 14.1 % (ref 11.5–15.5)
WBC: 9.2 10*3/uL (ref 4.0–10.5)
nRBC: 0 % (ref 0.0–0.2)

## 2023-06-24 LAB — ETHANOL: Alcohol, Ethyl (B): <10 mg/dL (ref <10)

## 2023-06-24 LAB — BASIC METABOLIC PANEL
Anion gap: 9 (ref 5–15)
BUN: 9 mg/dL (ref 6–20)
CO2: 20 mmol/L — ABNORMAL LOW (ref 22–32)
Calcium: 8 mg/dL — ABNORMAL LOW (ref 8.9–10.3)
Chloride: 104 mmol/L (ref 98–111)
Creatinine, Ser: 0.88 mg/dL (ref 0.44–1.00)
GFR, Estimated: >60 mL/min (ref >60)
Glucose, Bld: 130 mg/dL — ABNORMAL HIGH (ref 70–99)
Potassium: 4 mmol/L (ref 3.5–5.1)
Sodium: 133 mmol/L — ABNORMAL LOW (ref 135–145)

## 2023-06-24 LAB — MAGNESIUM: Magnesium: 1.8 mg/dL (ref 1.7–2.4)

## 2023-06-24 LAB — HIV ANTIBODY (ROUTINE TESTING W REFLEX): HIV Screen 4th Generation wRfx: NONREACTIVE

## 2023-06-24 MED ORDER — DIPHENHYDRAMINE HCL 25 MG PO CAPS
25.0000 mg | ORAL_CAPSULE | Freq: Four times a day (QID) | ORAL | Status: DC | PRN
  Administered 2023-06-24: 25 mg via ORAL
  Filled 2023-06-24: qty 1

## 2023-06-24 MED ORDER — METRONIDAZOLE 500 MG PO TABS: 500.0000 mg | ORAL_TABLET | Freq: Two times a day (BID) | ORAL | 0 refills | Status: AC

## 2023-06-24 NOTE — Discharge Summary (Signed)
Physician Discharge Summary   Patient: Samantha Olsen MRN: [id number] DOB: [birth year]  Admit date:     06/23/2023  Discharge date: 06/24/23  Discharge Physician: Tyrone Nine   PCP: Patient, No Pcp Per   Recommendations at discharge:  Follow up with allergy/immunology for formal testing. Discharged with epipen.   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 female with medical history significant of HTN.   Pt with h/o allergic rxn to insect sting once in past, was summer time, not sure what type of insect.   Earlier today, in normal state of health.  While outside, felt something bite her on the right hand.  She did not see what it was.  Shortly thereafter, she developed diffuse hives.  She went inside the house.  She experienced tongue swelling, shortness of breath, and nausea.  She had a syncopal episode.  She was in and out of consciousness prior to EMS arrival.  She had vomiting.  EMS gave 2 intramuscular shots of epinephrine, 50 mg of IV Benadryl, and 125 mg of IV Solu-Medrol prior to arrival.  She had improvement in symptoms.  Blood pressure was normal.  Currently, patient endorses fatigue.    Pt in to ED around 10am.   Pt significantly improved since then / allergic rxn seems to have resolved with resolution of hives.  Now HYPERtensive in ED and got evening dose of amlodipine 5mg .  Assessment and Plan: Hypomagnesemia: Supplemented.  Trichomonas infection - Flagyl 500mg  po BID x7 days. Partner testing/Tx, etc. discussed.  Leukocytosis UTI vs demargination in setting of getting steroids. Resolved quickly. No fever. UCx pending, no dysuria, UA was dirty catch. Tx trich as above.   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  H/o same  previously per pt, didn't see what bit / stung her though was outside and previous time was during summer as well (sounds suspicious for bee sting of some sort). Resolved at this time Now HYPER tensive slightly needing amlodipine in ED Rx antihistamines, short steroid burst (already sent to pharmacy). Epipen prescribed on discharge Needs to follow up with allergist for testing / desensitization on discharge.  Consultants: None Procedures performed: None  Disposition: Home Diet recommendation: Reg DISCHARGE MEDICATION: Allergies as of 06/24/2023   No Known Allergies      Medication List     STOP taking these medications    methylPREDNISolone 4 MG Tbpk tablet Commonly known as: MEDROL DOSEPAK       TAKE these medications    amLODipine 5 MG tablet Commonly known as: NORVASC Take 1 tablet (5 mg total) by mouth daily.   diphenhydrAMINE 25 MG tablet Commonly known as: BENADRYL Take 1 tablet (25 mg total) by mouth every 6 (six) hours for 3 days.   EPINEPHrine 0.3 mg/0.3 mL Soaj injection Commonly known as: EPI-PEN Inject 0.3 mg into the muscle as needed for anaphylaxis. In the event of a severe allergic reaction, inject into muscle and call 911.   famotidine 20 MG tablet Commonly known as: PEPCID Take 1 tablet (20 mg total) by mouth 2 (two) times daily for 3 days.   indomethacin 50 MG capsule Commonly known as: INDOCIN Take 1 capsule (50 mg total) by mouth 2 (two) times daily  with a meal. What changed: when to take this   metroNIDAZOLE 500 MG tablet Commonly known as: Flagyl Take 1 tablet (500 mg total) by mouth 2 (two) times daily for 7 days.   predniSONE 10 MG tablet Commonly known as: DELTASONE Take 4 tablets (40 mg total) by mouth daily for 3 days.        Follow-up Information     [facility] [place]. Schedule an appointment as soon as possible for a visit.   Specialty: Allergy and Asthma  Contact information: [address] [place] [address] [phone]               Discharge Exam: Filed Weights   06/23/23 1025  Weight: 68 kg  BP (!) 156/86   Pulse 79   Temp 97.8 F (36.6 C) (Oral)   Resp (!) 21   Ht 5\' 4"  (1.626 m)   Wt 68 kg   LMP 08/25/2014   SpO2 100%   BMI 25.75 kg/m   Well-appearing 52yo F in no distress. Feels much better wants to go home ASAP. No rashes.  Clear, nonlabored, no stridor.  No hives or edema  Condition at discharge: stable  The results of significant diagnostics from this hospitalization (including imaging, microbiology, ancillary and laboratory) are listed below for reference.   Imaging Studies: No results found.  Microbiology: Results for orders placed or performed during the hospital encounter of 12/29/10  Wet prep, genital     Status: Abnormal   Collection Time: 12/29/10  1:54 AM  Result Value Ref Range Status   Yeast Wet Prep HPF POC NONE SEEN NONE SEEN Final   Trich, Wet Prep NONE SEEN NONE SEEN Final   Clue Cells Wet Prep HPF POC FEW (A) NONE SEEN Final   WBC, Wet Prep HPF POC NONE SEEN NONE SEEN Final  GC/chlamydia probe amp, genital     Status: None   Collection Time: 12/29/10  1:55 AM  Result Value Ref Range Status   GC Probe Amp, Genital  NEGATIVE Final    NEGATIVE (NOTE)  Testing performed using the BD ProbeTec Qx Chlamydia trachomatis and Neisseria gonorrhea amplified DNA assay.  Performed at:  [facility]               [address]                [place], [place] [address]               [id number]   Chlamydia, DNA Probe  NEGATIVE Final    NEGATIVE (NOTE)  Testing performed using the BD ProbeT[employer] Qx Chlamydia trachomatis and Neisseria gonorrhea amplified DNA assay.  Performed at:  [facility]               [address]                [place], [place] [address]               [id number]     Labs: CBC: Recent Labs  Lab 06/23/23 1146 06/24/23 0300  WBC 22.1* 9.2  NEUTROABS 17.0*  --   HGB 15.9* 11.2*  HCT 46.6* 32.5*  MCV 105.7* 104.8*  PLT 274 173   Basic Metabolic Panel: Recent Labs  Lab 06/23/23 1146 06/23/23 1603 06/24/23 0300  NA 137 136 133*  K 2.7* 4.0 4.0  CL 99 107 104  CO2 13* 16* 20*  GLUCOSE 159* 78 130*  BUN 9 8 9   CREATININE 1.12* 0.82 0.88  CALCIUM 9.0 7.8* 8.0*  MG  --  1.0* 1.8   Liver Function Tests: Recent Labs  Lab 06/23/23 1146  AST 56*  ALT 20  ALKPHOS 77  BILITOT 0.9  PROT 8.6*  ALBUMIN 4.5   CBG: No results for input(s): "GLUCAP" in the last 168 hours.  Discharge time spent: greater than 30 minutes.  Signed: Tyrone Nine, MD [facility] 06/24/2023

## 2023-06-24 NOTE — ED Notes (Signed)
Attempted to collect blood x3. Unsuccessful.

## 2024-02-20 ENCOUNTER — Emergency Department (HOSPITAL_COMMUNITY)
Admission: EM | Admit: 2024-02-20 | Discharge: 2024-02-21 | Disposition: A | Discharge status: Home / Self Care | Payer: Self-pay | Attending: Emergency Medicine | Admitting: Emergency Medicine

## 2024-02-20 DIAGNOSIS — I10 Essential (primary) hypertension: Secondary | ICD-10-CM | POA: Insufficient documentation

## 2024-02-20 DIAGNOSIS — Z79899 Other long term (current) drug therapy: Secondary | ICD-10-CM | POA: Insufficient documentation

## 2024-02-20 LAB — CBC
HCT: 33.8 % — ABNORMAL LOW (ref 36.0–46.0)
Hemoglobin: 11.5 g/dL — ABNORMAL LOW (ref 12.0–15.0)
MCH: 33.2 pg (ref 26.0–34.0)
MCHC: 34 g/dL (ref 30.0–36.0)
MCV: 97.7 fL (ref 80.0–100.0)
Platelets: 160 10*3/uL (ref 150–400)
RBC: 3.46 MIL/uL — ABNORMAL LOW (ref 3.87–5.11)
RDW: 15.3 % (ref 11.5–15.5)
WBC: 4.4 10*3/uL (ref 4.0–10.5)
nRBC: 0 % (ref 0.0–0.2)

## 2024-02-20 LAB — BASIC METABOLIC PANEL
Anion gap: 13 (ref 5–15)
BUN: 12 mg/dL (ref 6–20)
CO2: 27 mmol/L (ref 22–32)
Calcium: 7.7 mg/dL — ABNORMAL LOW (ref 8.9–10.3)
Chloride: 99 mmol/L (ref 98–111)
Creatinine, Ser: 0.72 mg/dL (ref 0.44–1.00)
GFR, Estimated: >60 mL/min (ref >60)
Glucose, Bld: 109 mg/dL — ABNORMAL HIGH (ref 70–99)
Potassium: 3.6 mmol/L (ref 3.5–5.1)
Sodium: 139 mmol/L (ref 135–145)

## 2024-02-20 MED ORDER — AMLODIPINE BESYLATE 5 MG PO TABS: 5.0000 mg | ORAL_TABLET | Freq: Once | ORAL | Status: DC

## 2024-02-20 NOTE — ED Provider Triage Note (Signed)
 Emergency Medicine Provider Triage Evaluation Note  Samantha Olsen , a 53 y.o. female  was evaluated in triage.  Pt complains of hypertension.  Reports she has a history of hypertension.  Did not take her blood pressure medication today.  Denies chest pain, shortness of breath, headache or blurred vision.  Also complaining of dizziness that occurred when she was driving but denies any current dizziness.  Denies history of vertigo.  Denies one-sided weakness or numbness, neck pain.  Review of Systems  Positive:  Negative:   Physical Exam  BP (!) 144/99   Pulse (!) 108   Temp 98.5 F (36.9 C) (Oral)   Resp 14   LMP 08/25/2014   SpO2 99%  Gen:   Awake, no distress   Resp:  Normal effort  MSK:   Moves extremities without difficulty  Other:    Medical Decision Making  Medically screening exam initiated at 7:10 PM.  Appropriate orders placed.  Margo Aye was informed that the remainder of the evaluation will be completed by another provider, this initial triage assessment does not replace that evaluation, and the importance of remaining in the ED until their evaluation is complete.     Al Decant, PA-C 02/20/24 1911

## 2024-02-20 NOTE — ED Triage Notes (Signed)
 Pt here from home with c/o hand cramps and htn but has not had her htn meds today , also has some dizziness

## 2024-02-21 MED ORDER — LABETALOL HCL 5 MG/ML IV SOLN
10.0000 mg | Freq: Once | INTRAVENOUS | Status: AC
  Administered 2024-02-21: 10 mg via INTRAVENOUS
  Filled 2024-02-21: qty 4

## 2024-02-21 MED ORDER — MORPHINE SULFATE (PF) 4 MG/ML IV SOLN
4.0000 mg | Freq: Once | INTRAVENOUS | Status: AC
  Administered 2024-02-21: 4 mg via INTRAVENOUS
  Filled 2024-02-21: qty 1

## 2024-02-21 MED ORDER — AMLODIPINE BESYLATE 5 MG PO TABS
10.0000 mg | ORAL_TABLET | Freq: Once | ORAL | Status: AC
  Administered 2024-02-21: 10 mg via ORAL
  Filled 2024-02-21: qty 2

## 2024-02-21 MED ORDER — AMLODIPINE BESYLATE 10 MG PO TABS: 10.0000 mg | ORAL_TABLET | Freq: Every day | ORAL | 3 refills | Status: AC

## 2024-02-21 MED ORDER — ONDANSETRON HCL 4 MG/2ML IJ SOLN
4.0000 mg | Freq: Once | INTRAMUSCULAR | Status: AC
  Administered 2024-02-21: 4 mg via INTRAVENOUS
  Filled 2024-02-21: qty 2

## 2024-02-21 NOTE — ED Provider Notes (Signed)
 [facility] EMERGENCY DEPARTMENT AT [facility] Provider Note   CSN: [id number] Arrival date & time: 02/20/24  1854     History  Chief Complaint  Patient presents with   Hypertension    Samantha Olsen is a 53 y.o. female.  Patient presents to the emergency department for evaluation of elevated blood pressure.  She does have a history of hypertension, has not been taking her medications for a couple of days.  Patient reports that she had some headache, dizziness and that her muscles were cramping up earlier, causing her to come to the ED.       Home Medications Prior to Admission medications   Medication Sig Start Date End Date Taking? Authorizing Provider  amLODipine (NORVASC) 5 MG tablet Take 1 tablet (5 mg total) by mouth daily. 02/05/23   Piontek, Denny Peon, MD  diphenhydrAMINE (BENADRYL) 25 MG tablet Take 1 tablet (25 mg total) by mouth every 6 (six) hours for 3 days. 06/23/23 06/26/23  Gloris Manchester, MD  EPINEPHrine 0.3 mg/0.3 mL IJ SOAJ injection Inject 0.3 mg into the muscle as needed for anaphylaxis. In the event of a severe allergic reaction, inject into muscle and call 911. 06/23/23   Gloris Manchester, MD  famotidine (PEPCID) 20 MG tablet Take 1 tablet (20 mg total) by mouth 2 (two) times daily for 3 days. 06/23/23 06/26/23  Gloris Manchester, MD  indomethacin (INDOCIN) 50 MG capsule Take 1 capsule (50 mg total) by mouth 2 (two) times daily with a meal. Patient taking differently: Take 50 mg by mouth 2 (two) times daily. 02/05/23   Jannifer Franklin, MD      Allergies    Patient has no known allergies.    Review of Systems   Review of Systems  Physical Exam Updated Vital Signs BP (!) 149/87 (BP Location: Right Arm)   Pulse 87   Temp 98.2 F (36.8 C) (Oral)   Resp 18   LMP 08/25/2014   SpO2 99%  Physical Exam Vitals and nursing note reviewed.  Constitutional:      General: She is not in acute distress.    Appearance: She is well-developed.  HENT:     Head: Normocephalic and  atraumatic.     Mouth/Throat:     Mouth: Mucous membranes are moist.  Eyes:     General: Vision grossly intact. Gaze aligned appropriately.     Extraocular Movements: Extraocular movements intact.     Conjunctiva/sclera: Conjunctivae normal.  Cardiovascular:     Rate and Rhythm: Normal rate and regular rhythm.     Pulses: Normal pulses.     Heart sounds: Normal heart sounds, S1 normal and S2 normal. No murmur heard.    No friction rub. No gallop.  Pulmonary:     Effort: Pulmonary effort is normal. No respiratory distress.     Breath sounds: Normal breath sounds.  Abdominal:     General: Bowel sounds are normal.     Palpations: Abdomen is soft.     Tenderness: There is no abdominal tenderness. There is no guarding or rebound.     Hernia: No hernia is present.  Musculoskeletal:        General: No swelling.     Cervical back: Full passive range of motion without pain, normal range of motion and neck supple. No spinous process tenderness or muscular tenderness. Normal range of motion.     Right lower leg: No edema.     Left lower leg: No edema.  Skin:  General: Skin is warm and dry.     Capillary Refill: Capillary refill takes less than 2 seconds.     Findings: No ecchymosis, erythema, rash or wound.  Neurological:     General: No focal deficit present.     Mental Status: She is alert and oriented to person, place, and time.     GCS: GCS eye subscore is 4. GCS verbal subscore is 5. GCS motor subscore is 6.     Cranial Nerves: Cranial nerves 2-12 are intact.     Sensory: Sensation is intact.     Motor: Motor function is intact.     Coordination: Coordination is intact.  Psychiatric:        Attention and Perception: Attention normal.        Mood and Affect: Mood normal.        Speech: Speech normal.        Behavior: Behavior normal.     ED Results / Procedures / Treatments   Labs (all labs ordered are listed, but only abnormal results are displayed) Labs Reviewed  CBC -  Abnormal; Notable for the following components:      Result Value   RBC 3.46 (*)    Hemoglobin 11.5 (*)    HCT 33.8 (*)    All other components within normal limits  BASIC METABOLIC PANEL - Abnormal; Notable for the following components:   Glucose, Bld 109 (*)    Calcium 7.7 (*)    All other components within normal limits    EKG EKG Interpretation Date/Time:  Sunday February 20 2024 19:40:32 EDT Ventricular Rate:  94 PR Interval:  150 QRS Duration:  70 QT Interval:  412 QTC Calculation: 515 R Axis:   87  Text Interpretation: Normal sinus rhythm Prolonged QT Abnormal ECG When compared with ECG of 03-Jun-2012 08:27, No acute changes Confirmed by Gilda Crease [phone]) on 02/21/2024 1:51:08 AM  Radiology No results found.  Procedures Procedures    Medications Ordered in ED Medications  amLODipine (NORVASC) tablet 10 mg (10 mg Oral Given 02/21/24 0210)  morphine (PF) 4 MG/ML injection 4 mg (4 mg Intravenous Given 02/21/24 0409)  ondansetron (ZOFRAN) injection 4 mg (4 mg Intravenous Given 02/21/24 0408)  labetalol (NORMODYNE) injection 10 mg (10 mg Intravenous Given 02/21/24 0433)    ED Course/ Medical Decision Making/ A&P                                 Medical Decision Making Risk Prescription drug management.   Presents to the ED for elevated blood pressure.  Normal neurologic exam, no concern for intracranial bleed/CVA.  Patient without chest pain complaints.  Workup has been reassuring.  Patient initially administered her Norvasc but blood pressure remained high, given additional labs with improvement.  Will follow-up with PCP.        Final Clinical Impression(s) / ED Diagnoses Final diagnoses:  Primary hypertension    Rx / DC Orders ED Discharge Orders     None         Milani Lowenstein, Canary Brim, MD 02/21/24 [phone]

## 2024-02-24 ENCOUNTER — Encounter (HOSPITAL_COMMUNITY): Payer: Self-pay

## 2024-02-24 ENCOUNTER — Emergency Department (HOSPITAL_COMMUNITY)

## 2024-02-24 ENCOUNTER — Observation Stay (HOSPITAL_COMMUNITY)
Admission: EM | Admit: 2024-02-24 | Discharge: 2024-02-26 | Disposition: A | Discharge status: Home / Self Care | Attending: Internal Medicine | Admitting: Internal Medicine

## 2024-02-24 ENCOUNTER — Other Ambulatory Visit: Payer: Self-pay

## 2024-02-24 DIAGNOSIS — E872 Acidosis, unspecified: Secondary | ICD-10-CM | POA: Diagnosis not present

## 2024-02-24 DIAGNOSIS — E162 Hypoglycemia, unspecified: Principal | ICD-10-CM | POA: Diagnosis present

## 2024-02-24 DIAGNOSIS — E11649 Type 2 diabetes mellitus with hypoglycemia without coma: Secondary | ICD-10-CM | POA: Diagnosis present

## 2024-02-24 DIAGNOSIS — R9431 Abnormal electrocardiogram [ECG] [EKG]: Secondary | ICD-10-CM | POA: Insufficient documentation

## 2024-02-24 DIAGNOSIS — Z1152 Encounter for screening for COVID-19: Secondary | ICD-10-CM | POA: Insufficient documentation

## 2024-02-24 DIAGNOSIS — I10 Essential (primary) hypertension: Secondary | ICD-10-CM | POA: Insufficient documentation

## 2024-02-24 DIAGNOSIS — M109 Gout, unspecified: Secondary | ICD-10-CM | POA: Insufficient documentation

## 2024-02-24 DIAGNOSIS — R4182 Altered mental status, unspecified: Secondary | ICD-10-CM | POA: Insufficient documentation

## 2024-02-24 DIAGNOSIS — R7401 Elevation of levels of liver transaminase levels: Secondary | ICD-10-CM | POA: Diagnosis not present

## 2024-02-24 DIAGNOSIS — Z79899 Other long term (current) drug therapy: Secondary | ICD-10-CM | POA: Insufficient documentation

## 2024-02-24 DIAGNOSIS — F17210 Nicotine dependence, cigarettes, uncomplicated: Secondary | ICD-10-CM | POA: Insufficient documentation

## 2024-02-24 LAB — CBC
HCT: 37.9 % (ref 36.0–46.0)
Hemoglobin: 12.7 g/dL (ref 12.0–15.0)
MCH: 32.9 pg (ref 26.0–34.0)
MCHC: 33.5 g/dL (ref 30.0–36.0)
MCV: 98.2 fL (ref 80.0–100.0)
Platelets: 225 10*3/uL (ref 150–400)
RBC: 3.86 MIL/uL — ABNORMAL LOW (ref 3.87–5.11)
RDW: 15.2 % (ref 11.5–15.5)
WBC: 11.3 10*3/uL — ABNORMAL HIGH (ref 4.0–10.5)
nRBC: 0 % (ref 0.0–0.2)

## 2024-02-24 LAB — RESP PANEL BY RT-PCR (RSV, FLU A&B, COVID)  RVPGX2
Influenza A by PCR: NEGATIVE
Influenza B by PCR: NEGATIVE
Resp Syncytial Virus by PCR: NEGATIVE
SARS Coronavirus 2 by RT PCR: NEGATIVE

## 2024-02-24 LAB — COMPREHENSIVE METABOLIC PANEL
ALT: 18 U/L (ref 0–44)
AST: 52 U/L — ABNORMAL HIGH (ref 15–41)
Albumin: 4.1 g/dL (ref 3.5–5.0)
Alkaline Phosphatase: 65 U/L (ref 38–126)
Anion gap: 20 — ABNORMAL HIGH (ref 5–15)
BUN: 19 mg/dL (ref 6–20)
CO2: 19 mmol/L — ABNORMAL LOW (ref 22–32)
Calcium: 8.1 mg/dL — ABNORMAL LOW (ref 8.9–10.3)
Chloride: 99 mmol/L (ref 98–111)
Creatinine, Ser: 0.87 mg/dL (ref 0.44–1.00)
GFR, Estimated: >60 mL/min (ref >60)
Glucose, Bld: 77 mg/dL (ref 70–99)
Potassium: 3.9 mmol/L (ref 3.5–5.1)
Sodium: 138 mmol/L (ref 135–145)
Total Bilirubin: 1.4 mg/dL — ABNORMAL HIGH (ref 0.0–1.2)
Total Protein: 8.4 g/dL — ABNORMAL HIGH (ref 6.5–8.1)

## 2024-02-24 LAB — LIPASE, BLOOD: Lipase: 27 U/L (ref 11–51)

## 2024-02-24 LAB — CBG MONITORING, ED
Glucose-Capillary: 63 mg/dL — ABNORMAL LOW (ref 70–99)
Glucose-Capillary: 64 mg/dL — ABNORMAL LOW (ref 70–99)

## 2024-02-24 MED ORDER — DEXTROSE 50 % IV SOLN
50.0000 mL | Freq: Once | INTRAVENOUS | Status: AC
  Administered 2024-02-24: 50 mL via INTRAVENOUS
  Filled 2024-02-24: qty 50

## 2024-02-24 MED ORDER — ONDANSETRON 4 MG PO TBDP: 4.0000 mg | ORAL_TABLET | Freq: Once | ORAL | Status: DC | PRN

## 2024-02-24 MED ORDER — LACTATED RINGERS IV BOLUS
1000.0000 mL | Freq: Once | INTRAVENOUS | Status: AC
  Administered 2024-02-24: 1000 mL via INTRAVENOUS

## 2024-02-24 MED ORDER — ONDANSETRON HCL 4 MG/2ML IJ SOLN
4.0000 mg | Freq: Once | INTRAMUSCULAR | Status: AC
  Administered 2024-02-24: 4 mg via INTRAVENOUS
  Filled 2024-02-24: qty 2

## 2024-02-24 MED ORDER — ONDANSETRON HCL 4 MG/2ML IJ SOLN
4.0000 mg | Freq: Once | INTRAMUSCULAR | Status: AC | PRN
  Administered 2024-02-24: 4 mg via INTRAVENOUS
  Filled 2024-02-24: qty 2

## 2024-02-24 NOTE — ED Triage Notes (Addendum)
 EMS reports she is from home. Patient has been sleeping and feeling dizzy all day. Patient thought her BP was elevated however CBG was 54 on scene with EMS. No history of DM. No history of hypoglycemia. After trying to eat at home, patient became nauseous with vomiting x2. Last CBG was 73 with EMS. Patient is alert and oriented x4. No neuro deficits per EMS.   Patient states earlier today she had eaten some fruit and after that she felt weak and tired. Stated she had been wanting water all day but had not had any energy to get up and move around. Patient also states he was having so sensitivity to light.

## 2024-02-24 NOTE — ED Notes (Signed)
 Patient is actively vomiting in EMS triage. Unable to hold anything down. After 2 containers of orange juice, CBG is 64

## 2024-02-24 NOTE — ED Notes (Signed)
 Blood sugar is 63 in EMS triage. Patent was given 2 containers on orange juice to drink.

## 2024-02-25 ENCOUNTER — Emergency Department (HOSPITAL_COMMUNITY)

## 2024-02-25 DIAGNOSIS — E162 Hypoglycemia, unspecified: Principal | ICD-10-CM | POA: Diagnosis present

## 2024-02-25 LAB — BETA-HYDROXYBUTYRIC ACID: Beta-Hydroxybutyric Acid: 0.24 mmol/L (ref 0.05–0.27)

## 2024-02-25 LAB — CBC
HCT: 31.6 % — ABNORMAL LOW (ref 36.0–46.0)
Hemoglobin: 11 g/dL — ABNORMAL LOW (ref 12.0–15.0)
MCH: 32.9 pg (ref 26.0–34.0)
MCHC: 34.8 g/dL (ref 30.0–36.0)
MCV: 94.6 fL (ref 80.0–100.0)
Platelets: 195 10*3/uL (ref 150–400)
RBC: 3.34 MIL/uL — ABNORMAL LOW (ref 3.87–5.11)
RDW: 14.8 % (ref 11.5–15.5)
WBC: 5.9 10*3/uL (ref 4.0–10.5)
nRBC: 0 % (ref 0.0–0.2)

## 2024-02-25 LAB — URINALYSIS, ROUTINE W REFLEX MICROSCOPIC
Bilirubin Urine: NEGATIVE
Glucose, UA: >=500 mg/dL — AB
Ketones, ur: 20 mg/dL — AB
Leukocytes,Ua: NEGATIVE
Nitrite: NEGATIVE
Protein, ur: NEGATIVE mg/dL
Specific Gravity, Urine: 1.012 (ref 1.005–1.030)
pH: 5 (ref 5.0–8.0)

## 2024-02-25 LAB — COMPREHENSIVE METABOLIC PANEL
ALT: 12 U/L (ref 0–44)
AST: 33 U/L (ref 15–41)
Albumin: 3.6 g/dL (ref 3.5–5.0)
Alkaline Phosphatase: 55 U/L (ref 38–126)
Anion gap: 13 (ref 5–15)
BUN: 12 mg/dL (ref 6–20)
CO2: 24 mmol/L (ref 22–32)
Calcium: 7.7 mg/dL — ABNORMAL LOW (ref 8.9–10.3)
Chloride: 95 mmol/L — ABNORMAL LOW (ref 98–111)
Creatinine, Ser: 0.77 mg/dL (ref 0.44–1.00)
GFR, Estimated: >60 mL/min (ref >60)
Glucose, Bld: 186 mg/dL — ABNORMAL HIGH (ref 70–99)
Potassium: 3.6 mmol/L (ref 3.5–5.1)
Sodium: 132 mmol/L — ABNORMAL LOW (ref 135–145)
Total Bilirubin: 1.4 mg/dL — ABNORMAL HIGH (ref 0.0–1.2)
Total Protein: 7.5 g/dL (ref 6.5–8.1)

## 2024-02-25 LAB — CBG MONITORING, ED
Glucose-Capillary: 108 mg/dL — ABNORMAL HIGH (ref 70–99)
Glucose-Capillary: 58 mg/dL — ABNORMAL LOW (ref 70–99)
Glucose-Capillary: 174 mg/dL — ABNORMAL HIGH (ref 70–99)

## 2024-02-25 LAB — GLUCOSE, CAPILLARY
Glucose-Capillary: 153 mg/dL — ABNORMAL HIGH (ref 70–99)
Glucose-Capillary: 231 mg/dL — ABNORMAL HIGH (ref 70–99)
Glucose-Capillary: 184 mg/dL — ABNORMAL HIGH (ref 70–99)
Glucose-Capillary: 165 mg/dL — ABNORMAL HIGH (ref 70–99)
Glucose-Capillary: 145 mg/dL — ABNORMAL HIGH (ref 70–99)
Glucose-Capillary: 141 mg/dL — ABNORMAL HIGH (ref 70–99)
Glucose-Capillary: 132 mg/dL — ABNORMAL HIGH (ref 70–99)

## 2024-02-25 LAB — TROPONIN I (HIGH SENSITIVITY): Troponin I (High Sensitivity): 4 ng/L (ref <18)

## 2024-02-25 LAB — CORTISOL: Cortisol, Plasma: 16.9 ug/dL

## 2024-02-25 LAB — MAGNESIUM: Magnesium: 1.3 mg/dL — ABNORMAL LOW (ref 1.7–2.4)

## 2024-02-25 LAB — PHOSPHORUS: Phosphorus: 2.9 mg/dL (ref 2.5–4.6)

## 2024-02-25 MED ORDER — DEXTROSE 50 % IV SOLN
50.0000 mL | Freq: Once | INTRAVENOUS | Status: AC
  Administered 2024-02-25: 50 mL via INTRAVENOUS
  Filled 2024-02-25: qty 50

## 2024-02-25 MED ORDER — PROCHLORPERAZINE EDISYLATE 10 MG/2ML IJ SOLN: 5.0000 mg | Freq: Four times a day (QID) | INTRAMUSCULAR | Status: DC | PRN

## 2024-02-25 MED ORDER — MELATONIN 5 MG PO TABS: 5.0000 mg | ORAL_TABLET | Freq: Every evening | ORAL | Status: DC | PRN

## 2024-02-25 MED ORDER — MAGNESIUM SULFATE 2 GM/50ML IV SOLN
2.0000 g | Freq: Once | INTRAVENOUS | Status: AC
  Administered 2024-02-25: 2 g via INTRAVENOUS
  Filled 2024-02-25: qty 50

## 2024-02-25 MED ORDER — PREDNISONE 20 MG PO TABS
60.0000 mg | ORAL_TABLET | Freq: Once | ORAL | Status: AC
Start: 2024-02-25 — End: 2024-02-25
  Administered 2024-02-25: 60 mg via ORAL
  Filled 2024-02-25: qty 3

## 2024-02-25 MED ORDER — POLYETHYLENE GLYCOL 3350 17 G PO PACK: 17.0000 g | PACK | Freq: Every day | ORAL | Status: DC | PRN

## 2024-02-25 MED ORDER — OXYCODONE-ACETAMINOPHEN 5-325 MG PO TABS
1.0000 | ORAL_TABLET | Freq: Once | ORAL | Status: AC
  Administered 2024-02-25: 1 via ORAL
  Filled 2024-02-25: qty 1

## 2024-02-25 MED ORDER — AMLODIPINE BESYLATE 10 MG PO TABS
10.0000 mg | ORAL_TABLET | Freq: Every day | ORAL | Status: DC
  Administered 2024-02-26: 10 mg via ORAL
  Filled 2024-02-25 (×2): qty 1

## 2024-02-25 MED ORDER — IBUPROFEN 200 MG PO TABS
400.0000 mg | ORAL_TABLET | Freq: Four times a day (QID) | ORAL | Status: DC | PRN
Start: 2024-02-25 — End: 2024-02-26

## 2024-02-25 MED ORDER — CLONIDINE HCL 0.1 MG PO TABS: 0.3000 mg | ORAL_TABLET | Freq: Every day | ORAL | Status: DC

## 2024-02-25 MED ORDER — ACETAMINOPHEN 325 MG PO TABS
650.0000 mg | ORAL_TABLET | Freq: Four times a day (QID) | ORAL | Status: DC | PRN
  Administered 2024-02-25: 650 mg via ORAL
  Filled 2024-02-25: qty 2

## 2024-02-25 MED ORDER — DEXTROSE 10 % IV SOLN: INTRAVENOUS | Status: DC

## 2024-02-25 MED ORDER — COLCHICINE 0.6 MG PO TABS
0.6000 mg | ORAL_TABLET | Freq: Every day | ORAL | Status: DC
Start: 2024-02-25 — End: 2024-02-26
  Administered 2024-02-25 – 2024-02-26 (×2): 0.6 mg via ORAL
  Filled 2024-02-25 (×2): qty 1

## 2024-02-25 NOTE — ED Notes (Signed)
Patient ambulated to bathroom with no complaints

## 2024-02-25 NOTE — ED Provider Notes (Addendum)
 [place] EMERGENCY DEPARTMENT AT [facility] Provider Note   CSN: [id number] Arrival date & time: 02/24/24  2216     History  Chief Complaint  Patient presents with   Emesis   Hypoglycemia    Samantha Olsen is a 53 y.o. female.  53 year old female that presents the ER today secondary to low blood sugar.  Patient states that she was in her normal state of health recently.  She started Norvasc a couple days ago for hypertension.  Has been on indomethacin for the last 5-6 days for a gout flare.  She states that she has started having some nausea, lightheadedness, sweatiness earlier today.  Flexion and pass out.  She started throwing up.  EMS was called after did not getting better blood sugar was in the 50s.  She was given p.o. and her blood sugar came up to the 60s and subsequently she started throwing up again.  Present here for further evaluation.  Denies any new chest pain back pain.  She does have some knee pain and elbow pain to go along with her gouty finger pain and she has some toe pain from ill fitting shoes.  No fevers.  No,.  No history of low blood sugar.   Emesis Hypoglycemia Associated symptoms: vomiting        Home Medications Prior to Admission medications   Medication Sig Start Date End Date Taking? Authorizing Provider  amLODipine (NORVASC) 10 MG tablet Take 1 tablet (10 mg total) by mouth daily. 02/21/24  Yes Pollina, Canary Brim, MD  cloNIDine (CATAPRES) 0.3 MG tablet Take 0.3 mg by mouth daily.   Yes [provider]  diclofenac Sodium (VOLTAREN) 1 % GEL Apply 4 g topically as needed (low bsck and shoullder pain).   Yes [provider]  EPINEPHrine 0.3 mg/0.3 mL IJ SOAJ injection Inject 0.3 mg into the muscle as needed for anaphylaxis. In the event of a severe allergic reaction, inject into muscle and call 911. 06/23/23  Yes Gloris Manchester, MD  ibuprofen (ADVIL) 200 MG tablet Take 400 mg by mouth every 6 (six) hours as needed for  headache or mild pain (pain score 1-3).   Yes [provider]      Allergies    Patient has no known allergies.    Review of Systems   Review of Systems  Gastrointestinal:  Positive for vomiting.    Physical Exam Updated Vital Signs BP 114/67 (BP Location: Right Arm)   Pulse 71   Temp 98.5 F (36.9 C) (Oral)   Resp 18   Ht 5\' 4"  (1.626 m)   Wt 68 kg   LMP 08/25/2014   SpO2 100%   BMI 25.75 kg/m  Physical Exam Vitals and nursing note reviewed.  Constitutional:      Appearance: She is well-developed.  HENT:     Head: Normocephalic and atraumatic.  Cardiovascular:     Rate and Rhythm: Normal rate and regular rhythm.  Pulmonary:     Effort: No respiratory distress.     Breath sounds: No stridor.  Abdominal:     General: There is no distension.  Musculoskeletal:        General: Normal range of motion.     Cervical back: Normal range of motion.  Skin:    General: Skin is warm and dry.  Neurological:     Mental Status: She is alert.     ED Results / Procedures / Treatments   Labs (all labs ordered are  listed, but only abnormal results are displayed) Labs Reviewed  COMPREHENSIVE METABOLIC PANEL - Abnormal; Notable for the following components:      Result Value   CO2 19 (*)    Calcium 8.1 (*)    Total Protein 8.4 (*)    AST 52 (*)    Total Bilirubin 1.4 (*)    Anion gap 20 (*)    All other components within normal limits  CBC - Abnormal; Notable for the following components:   WBC 11.3 (*)    RBC 3.86 (*)    All other components within normal limits  URINALYSIS, ROUTINE W REFLEX MICROSCOPIC - Abnormal; Notable for the following components:   APPearance HAZY (*)    Glucose, UA >=500 (*)    Hgb urine dipstick SMALL (*)    Ketones, ur 20 (*)    Bacteria, UA RARE (*)    All other components within normal limits  CBG MONITORING, ED - Abnormal; Notable for the following components:   Glucose-Capillary 63 (*)    All other components within normal  limits  CBG MONITORING, ED - Abnormal; Notable for the following components:   Glucose-Capillary 64 (*)    All other components within normal limits  CBG MONITORING, ED - Abnormal; Notable for the following components:   Glucose-Capillary 108 (*)    All other components within normal limits  CBG MONITORING, ED - Abnormal; Notable for the following components:   Glucose-Capillary 58 (*)    All other components within normal limits  RESP PANEL BY RT-PCR (RSV, FLU A&B, COVID)  RVPGX2  LIPASE, BLOOD  C-PEPTIDE  BETA-HYDROXYBUTYRIC ACID  SULFONYLUREA HYPOGLYCEMICS PANEL, SERUM  CORTISOL  PROINSULIN/INSULIN RATIO  TROPONIN I (HIGH SENSITIVITY)    EKG EKG Interpretation Date/Time:  Thursday February 24 2024 23:19:26 EDT Ventricular Rate:  82 PR Interval:  146 QRS Duration:  74 QT Interval:  426 QTC Calculation: 497 R Axis:   75  Text Interpretation: Normal sinus rhythm Biatrial enlargement Nonspecific ST abnormality Prolonged QT Abnormal ECG When compared with ECG of 20-Feb-2024 19:40, PREVIOUS ECG IS PRESENT Confirmed by Marily Memos [phone]) on 02/24/2024 11:20:39 PM  Radiology CT Head Wo Contrast Result Date: 02/25/2024 CLINICAL DATA:  Altered mental status with dizziness, blurry vision and vomiting. EXAM: CT HEAD WITHOUT CONTRAST TECHNIQUE: Contiguous axial images were obtained from the base of the skull through the vertex without intravenous contrast. RADIATION DOSE REDUCTION: This exam was performed according to the departmental dose-optimization program which includes automated exposure control, adjustment of the mA and/or kV according to patient size and/or use of iterative reconstruction technique. COMPARISON:  April 05, 2022 FINDINGS: Brain: No evidence of acute infarction, hemorrhage, hydrocephalus, extra-axial collection or mass lesion/mass effect. Vascular: No hyperdense vessel or unexpected calcification. Skull: Normal. Negative for fracture or focal lesion. Sinuses/Orbits: No  acute finding. Other: None. IMPRESSION: No acute intracranial pathology. Electronically Signed   By: Aram Candela M.D.   On: 02/25/2024 00:14   DG Chest 2 View Result Date: 02/25/2024 CLINICAL DATA:  Wheezing EXAM: CHEST - 2 VIEW COMPARISON:  11/02/2011 FINDINGS: The heart size and mediastinal contours are within normal limits. Both lungs are clear. The visualized skeletal structures are unremarkable. IMPRESSION: No active cardiopulmonary disease. Electronically Signed   By: Charlett Nose M.D.   On: 02/25/2024 00:06    Procedures .Critical Care  Performed by: Marily Memos, MD Authorized by: Marily Memos, MD   Critical care provider statement:    Critical care time (minutes):  30  Critical care was necessary to treat or prevent imminent or life-threatening deterioration of the following conditions:  Metabolic crisis   Critical care was time spent personally by me on the following activities:  Development of treatment plan with patient or surrogate, discussions with consultants, evaluation of patient's response to treatment, examination of patient, ordering and review of laboratory studies, ordering and review of radiographic studies, ordering and performing treatments and interventions, pulse oximetry, re-evaluation of patient's condition and review of old charts     Medications Ordered in ED Medications  dextrose 10 % infusion ( Intravenous New Bag/Given 02/25/24 0317)  acetaminophen (TYLENOL) tablet 650 mg (has no administration in time range)  prochlorperazine (COMPAZINE) injection 5 mg (has no administration in time range)  melatonin tablet 5 mg (has no administration in time range)  polyethylene glycol (MIRALAX / GLYCOLAX) packet 17 g (has no administration in time range)  cloNIDine (CATAPRES) tablet 0.3 mg (has no administration in time range)  ondansetron (ZOFRAN) injection 4 mg (4 mg Intravenous Given 02/24/24 2246)  ondansetron (ZOFRAN) injection 4 mg (4 mg Intravenous Given  02/24/24 2334)  lactated ringers bolus 1,000 mL (0 mLs Intravenous Stopped 02/25/24 0112)  dextrose 50 % solution 50 mL (50 mLs Intravenous Given 02/24/24 2336)  predniSONE (DELTASONE) tablet 60 mg (60 mg Oral Given 02/25/24 0258)  oxyCODONE-acetaminophen (PERCOCET/ROXICET) 5-325 MG per tablet 1 tablet (1 tablet Oral Given 02/25/24 0258)  dextrose 50 % solution 50 mL (50 mLs Intravenous Given 02/25/24 0314)    ED Course/ Medical Decision Making/ A&P                                 Medical Decision Making Amount and/or Complexity of Data Reviewed Labs: ordered. Radiology: ordered.  Risk Prescription drug management. Decision regarding hospitalization.  I wonder if the indomethacin caused her emesis which subsequently caused her hypoglycemia.  Will check for other causes of hypoglycemia.  As she is throwing up now with some somewhat low blood sugars we will go ahead and give D50.  Encourage p.o. intake once her nausea improves. Patient is nausea improved and she was ambulate without difficulty.  She states that she is feeling well. Labs and workup are relatively reassuring.  Patient started having some blurry vision and lightheadedness again.  Recheck the blood sugar was back in the 50s even though she has been eating and drinking applesauce and juice.  After a quick literature search it does appear that indomethacin can cause some hypoglycemia that usually gets better after cessation of the medication.  There is no other obvious etiology for it but now that she has had 2-3 doses of D50 and is not able to maintain her blood sugars with p.o. intake and feel its prudent to start her on D10 infusion and have her admitted for further workup and management to ensure the blood sugars are more stable prior to discharge and also to decipher whether or not there is any other underlying causes.   D/w Dr. Margo Aye for admission. Final Clinical Impression(s) / ED Diagnoses Final diagnoses:  Hypoglycemia     Rx / DC Orders ED Discharge Orders     None         Marquel Pottenger, Barbara Cower, MD 02/25/24 2130    Marily Memos, MD 02/25/24 [phone]

## 2024-02-25 NOTE — ED Notes (Signed)
Patient given 2 orange juices.

## 2024-02-25 NOTE — Progress Notes (Signed)
 [facility] night cross cover note:   I was contacted by RN requesting clarification of the patient's existing order for acute 2-hour CBG monitoring.  Patient was initially admitted with hypoglycemia, prompting the acute 2-hour CBG monitoring.  However, most recent hypoglycemic episode occurred nearly 24 hours ago, with ensuing blood sugars in the range of the 140s to 230s, with most recent blood sugar noted to be 141 this evening.  She is not currently on any D5 or D10 drip.  In light of this trend in the patient's blood sugar, I have adjusted her every 2 hours CBG monitoring to every 4 hours.     Newton Pigg, DO Hospitalist

## 2024-02-25 NOTE — Progress Notes (Signed)
 Brief same day note:  Patient is a 53 year old female with history of hypertension, gout, recently enrolled in a trial where clonidine is used to treat gout flare presented with sleepiness, dizziness, nausea, vomiting, diaphoresis.  On prednisone she was hypoglycemic.  Does not take any insulin or any antidiabetic medication at home.  Started on D10.  Currently hemodynamically stable.  Patient seen and examined at the bedside today.  During my evaluation, she was alert and oriented, fully alert and awake.  Blood sugars are above normal this morning.  D10 started.  Plan to monitor blood sugars today without D10. She complains of acute gout flare on her right middle finger.  Assessment and plan:  Hypoglycemia: Unclear etiology but could be from clonidine.  Blood sugars were low on presentation, started on D10.  Currently blood sugars high.  D10 will be stopped.  Continue to monitor blood sugars.  Follow-up C-peptide, sulfonylurea.  Cortisol level normal.  Gout flare: Has gouty flare on bilateral big toes, right middle finger.  Was using clonidine as clinical trial.  Patient does not have insurance and she could not afford medication for gout.  Will start on colchicine.  Patient recently has insurance and will follow-up with PCP  Hypertension: BP stable.  Takes amlodipine at home  Mild elevated liver enzymes: Continue to monitor  Hypomagnesemia: Supplemented with magnesium

## 2024-02-25 NOTE — Progress Notes (Signed)
 Chaplain responded to a request to provide information about the Advance Directives form to Pt. After receiving the information, Pt requested some time to fill out the form. Chaplain asked Pt to notify when the form is ready for notarization.   02/25/24 0956  Spiritual Encounters  Type of Visit Initial  Care provided to: Patient  Referral source Clinical staff  Reason for visit Advance directives  OnCall Visit No

## 2024-02-25 NOTE — H&P (Addendum)
 History and Physical  Samantha Olsen:[id number] DOB: 01-09-1971 DOA: 02/24/2024  Referring physician: Dr. Clayborne Dana, EDP  PCP: Pcp, No  Outpatient Specialists: None Patient coming from: Home  Chief Complaint: Dizzy and hypersomnolent.  HPI: Samantha Olsen is a 53 y.o. female with medical history significant for hypertension and gout, recently enrolled in a trial where clonidine is used to treat gout flare, has a gout flare in her right middle finger and left great toe (Clonidine was started 5 days ago), who presents to the ER via EMS from home due to hypersomnolence and dizziness all day today.  Associated with nausea and vomiting x 2, generalized weakness and diaphoresis.  She called EMS.  Upon EMS arrival, the patient was noted to be hypoglycemic with CBG of 54.  No prior history of hypoglycemia.  Denies use of hypoglycemics.  She was brought to the ER for further evaluation.  In the ER, her dizziness persisted and the patient had recurrent episode of hypoglycemia with CBGs in the 50s.  After multiple corrections for hypoglycemia with D50, the patient was started on IV D10 at 125 cc/h.   [facility], hospitalist service, was asked to admit for hypoglycemia of unclear etiology.  ED Course: Temperature 98.5.  BP 140/67, pulse 71, respiratory rate 18, O2 saturation 100% on room air.  Lab studies notable for WBC 11.3.  Serum bicarb 19, anion gap 20, serum glucose 37, AST 52, ALT 18, T bilirubin 1.4.  Troponin 4.  Review of Systems: Review of systems as noted in the HPI. All other systems reviewed and are negative.   Past Medical History:  Diagnosis Date   Gout    Hypertension    Past Surgical History:  Procedure Laterality Date   LAPAROSCOPY FOR ECTOPIC PREGNANCY  01/2011    Social History:  reports that she has been smoking cigarettes. She does not have any smokeless tobacco history on file. She reports current alcohol use of about 1.0 standard drink of alcohol per week. She reports current  drug use. Frequency: 1.00 time per week. Drug: Marijuana.   No Known Allergies  Family history: None reported.  Prior to Admission medications   Medication Sig Start Date End Date Taking? Authorizing Provider  amLODipine (NORVASC) 10 MG tablet Take 1 tablet (10 mg total) by mouth daily. 02/21/24  Yes Pollina, Canary Brim, MD  cloNIDine (CATAPRES) 0.3 MG tablet Take 0.3 mg by mouth daily.   Yes [provider]  diclofenac Sodium (VOLTAREN) 1 % GEL Apply 4 g topically as needed (low bsck and shoullder pain).   Yes [provider]  EPINEPHrine 0.3 mg/0.3 mL IJ SOAJ injection Inject 0.3 mg into the muscle as needed for anaphylaxis. In the event of a severe allergic reaction, inject into muscle and call 911. 06/23/23  Yes Gloris Manchester, MD  ibuprofen (ADVIL) 200 MG tablet Take 400 mg by mouth every 6 (six) hours as needed for headache or mild pain (pain score 1-3).   Yes [provider]    Physical Exam: BP 114/67 (BP Location: Right Arm)   Pulse 71   Temp 98.5 F (36.9 C) (Oral)   Resp 18   Ht 5\' 4"  (1.626 m)   Wt 68 kg   LMP 08/25/2014   SpO2 100%   BMI 25.75 kg/m   General: 53 y.o. year-old female well developed well nourished in no acute distress.  Alert and oriented x3. Cardiovascular: Regular rate and rhythm with no rubs or gallops.  No thyromegaly or  JVD noted.  No lower extremity edema. 2/4 pulses in all 4 extremities. Respiratory: Clear to auscultation with no wheezes or rales. Good inspiratory effort. Abdomen: Soft nontender nondistended with normal bowel sounds x4 quadrants. Muskuloskeletal: No cyanosis, clubbing or edema noted bilaterally.  Right middle finger is mildly edematous from gout flare. Neuro: CN II-XII intact, strength, sensation, reflexes Skin: No ulcerative lesions noted or rashes Psychiatry: Judgement and insight appear normal. Mood is appropriate for condition and setting          Labs on Admission:  Basic Metabolic  Panel: Recent Labs  Lab 02/20/24 1912 02/24/24 2241  NA 139 138  K 3.6 3.9  CL 99 99  CO2 27 19*  GLUCOSE 109* 77  BUN 12 19  CREATININE 0.72 0.87  CALCIUM 7.7* 8.1*   Liver Function Tests: Recent Labs  Lab 02/24/24 2241  AST 52*  ALT 18  ALKPHOS 65  BILITOT 1.4*  PROT 8.4*  ALBUMIN 4.1   Recent Labs  Lab 02/24/24 2241  LIPASE 27   No results for input(s): "AMMONIA" in the last 168 hours. CBC: Recent Labs  Lab 02/20/24 1912 02/24/24 2241  WBC 4.4 11.3*  HGB 11.5* 12.7  HCT 33.8* 37.9  MCV 97.7 98.2  PLT 160 225   Cardiac Enzymes: No results for input(s): "CKTOTAL", "CKMB", "CKMBINDEX", "TROPONINI" in the last 168 hours.  BNP (last 3 results) No results for input(s): "BNP" in the last 8760 hours.  ProBNP (last 3 results) No results for input(s): "PROBNP" in the last 8760 hours.  CBG: Recent Labs  Lab 02/24/24 2223 02/24/24 2310 02/25/24 0147 02/25/24 0308  GLUCAP 63* 64* 108* 58*    Radiological Exams on Admission: CT Head Wo Contrast Result Date: 02/25/2024 CLINICAL DATA:  Altered mental status with dizziness, blurry vision and vomiting. EXAM: CT HEAD WITHOUT CONTRAST TECHNIQUE: Contiguous axial images were obtained from the base of the skull through the vertex without intravenous contrast. RADIATION DOSE REDUCTION: This exam was performed according to the departmental dose-optimization program which includes automated exposure control, adjustment of the mA and/or kV according to patient size and/or use of iterative reconstruction technique. COMPARISON:  April 05, 2022 FINDINGS: Brain: No evidence of acute infarction, hemorrhage, hydrocephalus, extra-axial collection or mass lesion/mass effect. Vascular: No hyperdense vessel or unexpected calcification. Skull: Normal. Negative for fracture or focal lesion. Sinuses/Orbits: No acute finding. Other: None. IMPRESSION: No acute intracranial pathology. Electronically Signed   By: Aram Candela M.D.   On:  02/25/2024 00:14   DG Chest 2 View Result Date: 02/25/2024 CLINICAL DATA:  Wheezing EXAM: CHEST - 2 VIEW COMPARISON:  11/02/2011 FINDINGS: The heart size and mediastinal contours are within normal limits. Both lungs are clear. The visualized skeletal structures are unremarkable. IMPRESSION: No active cardiopulmonary disease. Electronically Signed   By: Charlett Nose M.D.   On: 02/25/2024 00:06    EKG: I independently viewed the EKG done and my findings are as followed: Normal sinus rhythm rate of 82.  Nonspecific ST-T changes.  QTc 497.  Assessment/Plan Present on Admission:  Hypoglycemia  Principal Problem:   Hypoglycemia  Hypoglycemia of unclear etiology Clonidine could contribute to hypoglycemia by inhibition of the sympathetic response, may blunt the normal response to low blood sugar, decrease gluconeogenesis, and increase insulin sensitivity. Recently enrolled in a trial where clonidine is used to treat gout flare, has a gout flare in her right middle finger and left great toe (Clonidine was started 5 days ago) Follow cortisol, sulfonylurea panel, beta  hydroxybutyrate acid, C-peptide, cortisol, insulin  Gout flare Obtain uric acid level As needed analgesics Gentle IV hydration  Hypertension BP is at goal Monitor vital signs Resume home amlodipine when blood pressure permits  Elevated liver enzymes, unclear etiology AST 52, T. bili 1.4 Repeat CMP and avoid hepatotoxic agents.  High anion gap metabolic acidosis Serum bicarb 19, anion gap 20 Continue gentle IV fluid hydration repeat CMP  QTc prolongation Admission twelve-lead EKG with QTc of 497 Optimize magnesium and potassium level Repeat twelve-lead EKG    Time: 75 minutes.    DVT prophylaxis: SCDs  Code Status: Full code  Family Communication: None at bedside  Disposition Plan: Admitted to MedSurg unit  Consults called: None.  Admission status: Observation status   Status is:  Observation    Darlin Drop MD [facility] Pager [phone]  If 7PM-7AM, please contact night-coverage [email or web address] Password [facility]  02/25/2024, 3:57 AM

## 2024-02-26 ENCOUNTER — Other Ambulatory Visit (HOSPITAL_COMMUNITY): Payer: Self-pay

## 2024-02-26 DIAGNOSIS — E162 Hypoglycemia, unspecified: Secondary | ICD-10-CM | POA: Diagnosis not present

## 2024-02-26 LAB — MAGNESIUM: Magnesium: 2.1 mg/dL (ref 1.7–2.4)

## 2024-02-26 LAB — C-PEPTIDE: C-Peptide: 10.5 ng/mL — ABNORMAL HIGH (ref 1.1–4.4)

## 2024-02-26 LAB — GLUCOSE, CAPILLARY: Glucose-Capillary: 118 mg/dL — ABNORMAL HIGH (ref 70–99)

## 2024-02-26 MED ORDER — HYDRALAZINE HCL 20 MG/ML IJ SOLN
10.0000 mg | INTRAMUSCULAR | Status: DC | PRN
  Administered 2024-02-26: 10 mg via INTRAVENOUS
  Filled 2024-02-26: qty 1

## 2024-02-26 MED ORDER — COLCHICINE 0.6 MG PO TABS
0.6000 mg | ORAL_TABLET | Freq: Two times a day (BID) | ORAL | 0 refills | Status: AC
  Filled 2024-02-26: qty 30, 23d supply, fill #0

## 2024-02-26 MED ORDER — COLCHICINE 0.6 MG PO TABS
0.6000 mg | ORAL_TABLET | Freq: Two times a day (BID) | ORAL | Status: DC
  Filled 2024-02-26: qty 1

## 2024-02-26 NOTE — Discharge Summary (Addendum)
 Physician Discharge Summary  ALETHA ALLEBACH UJW:[id number] DOB: 05-11-1971 DOA: 02/24/2024  PCP: Pcp, No  Admit date: 02/24/2024 Discharge date: 02/26/2024  Admitted From: Home Disposition:  Home  Discharge Condition:Stable CODE STATUS:FULL Diet recommendation: Heart Healthy  Brief/Interim Summary: Patient is a 53 year old female with history of hypertension, gout, recently enrolled in a trial where clonidine is used to treat gout flare presented with sleepiness, dizziness, nausea, vomiting, diaphoresis.  On presentation, she was hypoglycemic.  Does not take any insulin or any antidiabetic medication at home.  Started on D10.  D10 discontinued on 3/21.  Since then her blood sugars have been stable.  Hypoglycemia could be from clonidine.  Medically stable for discharge home today  Following problems were addressed during the hospitalization:  Hypoglycemia: Unclear etiology but could be from clonidine.  Blood sugars were low on presentation, started on D10.  Blood sugars has been stable without D10 for last 24 hours. She has been enrolled in a research trial where clonidine was used for treatment of her gout.  Some research studies suggest that clonidine could be started with hypoglycemia.  Will recommend to discontinue clonidine. C-peptide high.  Cortisol level normal. Blood sugar stable this morning.  Gout flare: Has gouty flare on bilateral big toes, right middle finger.  Was using clonidine as clinical trial.  Patient does not have insurance and she could not afford medication for gout.  Will start on colchicine.  Patient recently has insurance and will follow-up with PCP   Hypertension: BP stable.  Takes amlodipine at home.  Continue   Mild elevated liver enzymes: Normalized now   Hypomagnesemia: Supplemented with magnesium  Discharge Diagnoses:  Principal Problem:   Hypoglycemia    Discharge Instructions  Discharge Instructions     Diet - low sodium heart healthy    Complete by: As directed    Discharge instructions   Complete by: As directed    1)Please take your medications as instructed 2)Follow up with a PCP in 1-2 weeks   Increase activity slowly   Complete by: As directed       Allergies as of 02/26/2024   No Known Allergies      Medication List     STOP taking these medications    cloNIDine 0.3 MG tablet Commonly known as: CATAPRES       TAKE these medications    amLODipine 10 MG tablet Commonly known as: NORVASC Take 1 tablet (10 mg total) by mouth daily.   colchicine 0.6 MG tablet Take 1 tablet (0.6 mg total) by mouth 2 (two) times daily. Take twice daily for a week then continue taking once daily   diclofenac Sodium 1 % Gel Commonly known as: VOLTAREN Apply 4 g topically as needed (low bsck and shoullder pain).   EPINEPHrine 0.3 mg/0.3 mL Soaj injection Commonly known as: EPI-PEN Inject 0.3 mg into the muscle as needed for anaphylaxis. In the event of a severe allergic reaction, inject into muscle and call 911.   ibuprofen 200 MG tablet Commonly known as: ADVIL Take 400 mg by mouth every 6 (six) hours as needed for headache or mild pain (pain score 1-3).        No Known Allergies  Consultations: None   Procedures/Studies: CT Head Wo Contrast Result Date: 02/25/2024 CLINICAL DATA:  Altered mental status with dizziness, blurry vision and vomiting. EXAM: CT HEAD WITHOUT CONTRAST TECHNIQUE: Contiguous axial images were obtained from the base of the skull through the vertex without intravenous contrast. RADIATION DOSE  REDUCTION: This exam was performed according to the departmental dose-optimization program which includes automated exposure control, adjustment of the mA and/or kV according to patient size and/or use of iterative reconstruction technique. COMPARISON:  April 05, 2022 FINDINGS: Brain: No evidence of acute infarction, hemorrhage, hydrocephalus, extra-axial collection or mass lesion/mass effect.  Vascular: No hyperdense vessel or unexpected calcification. Skull: Normal. Negative for fracture or focal lesion. Sinuses/Orbits: No acute finding. Other: None. IMPRESSION: No acute intracranial pathology. Electronically Signed   By: Aram Candela M.D.   On: 02/25/2024 00:14   DG Chest 2 View Result Date: 02/25/2024 CLINICAL DATA:  Wheezing EXAM: CHEST - 2 VIEW COMPARISON:  11/02/2011 FINDINGS: The heart size and mediastinal contours are within normal limits. Both lungs are clear. The visualized skeletal structures are unremarkable. IMPRESSION: No active cardiopulmonary disease. Electronically Signed   By: Charlett Nose M.D.   On: 02/25/2024 00:06      Subjective: Patient seen and examined at bedside today.  Hemodynamically stable for discharge.  Blood sugar stable.  Discharge Exam: Vitals:   02/26/24 0818 02/26/24 0926  BP: 124/76 124/76  Pulse: 72   Resp: 15   Temp: 97.9 F (36.6 C)   SpO2: 100%    Vitals:   02/26/24 0518 02/26/24 0611 02/26/24 0818 02/26/24 0926  BP: (!) 134/122 (!) 161/104 124/76 124/76  Pulse: 89  72   Resp: 20  15   Temp: (!) 97.4 F (36.3 C)  97.9 F (36.6 C)   TempSrc: Oral Oral Oral   SpO2: 99%  100%   Weight:      Height:        General: Pt is alert, awake, not in acute distress Cardiovascular: RRR, S1/S2 +, no rubs, no gallops Respiratory: CTA bilaterally, no wheezing, no rhonchi Abdominal: Soft, NT, ND, bowel sounds + Extremities: no edema, no cyanosis    The results of significant diagnostics from this hospitalization (including imaging, microbiology, ancillary and laboratory) are listed below for reference.     Microbiology: Recent Results (from the past 240 hours)  Resp panel by RT-PCR (RSV, Flu A&B, Covid) Anterior Nasal Swab     Status: None   Collection Time: 02/24/24 10:41 PM   Specimen: Anterior Nasal Swab  Result Value Ref Range Status   SARS Coronavirus 2 by RT PCR NEGATIVE NEGATIVE Final   Influenza A by PCR NEGATIVE  NEGATIVE Final   Influenza B by PCR NEGATIVE NEGATIVE Final    Comment: (NOTE) The Xpert Xpress SARS-CoV-2/FLU/RSV plus assay is intended as an aid in the diagnosis of influenza from Nasopharyngeal swab specimens and should not be used as a sole basis for treatment. Nasal washings and aspirates are unacceptable for Xpert Xpress SARS-CoV-2/FLU/RSV testing.  Fact Sheet for Patients: BloggerCourse.com  Fact Sheet for Healthcare Providers: SeriousBroker.it  This test is not yet approved or cleared by the [place] FDA and has been authorized for detection and/or diagnosis of SARS-CoV-2 by FDA under an Emergency Use Authorization (EUA). This EUA will remain in effect (meaning this test can be used) for the duration of the COVID-19 declaration under Section 564(b)(1) of the Act, 21 U.S.C. section 360bbb-3(b)(1), unless the authorization is terminated or revoked.     Resp Syncytial Virus by PCR NEGATIVE NEGATIVE Final    Comment: (NOTE) Fact Sheet for Patients: BloggerCourse.com  Fact Sheet for Healthcare Providers: SeriousBroker.it  This test is not yet approved or cleared by the [place] FDA and has been authorized for detection and/or diagnosis of SARS-CoV-2  by FDA under an Emergency Use Authorization (EUA). This EUA will remain in effect (meaning this test can be used) for the duration of the COVID-19 declaration under Section 564(b)(1) of the Act, 21 U.S.C. section 360bbb-3(b)(1), unless the authorization is terminated or revoked.  Performed at [facility], [address], [place], [place] [address]      Labs: BNP (last 3 results) No results for input(s): "BNP" in the last 8760 hours. Basic Metabolic Panel: Recent Labs  Lab 02/20/24 1912 02/24/24 2241 02/25/24 0739 02/26/24 0656  NA 139 138 132*  --   K 3.6 3.9 3.6  --   CL 99 99 95*  --   CO2 27  19* 24  --   GLUCOSE 109* 77 186*  --   BUN 12 19 12   --   CREATININE 0.72 0.87 0.77  --   CALCIUM 7.7* 8.1* 7.7*  --   MG  --   --  1.3* 2.1  PHOS  --   --  2.9  --    Liver Function Tests: Recent Labs  Lab 02/24/24 2241 02/25/24 0739  AST 52* 33  ALT 18 12  ALKPHOS 65 55  BILITOT 1.4* 1.4*  PROT 8.4* 7.5  ALBUMIN 4.1 3.6   Recent Labs  Lab 02/24/24 2241  LIPASE 27   No results for input(s): "AMMONIA" in the last 168 hours. CBC: Recent Labs  Lab 02/20/24 1912 02/24/24 2241 02/25/24 0739  WBC 4.4 11.3* 5.9  HGB 11.5* 12.7 11.0*  HCT 33.8* 37.9 31.6*  MCV 97.7 98.2 94.6  PLT 160 225 195   Cardiac Enzymes: No results for input(s): "CKTOTAL", "CKMB", "CKMBINDEX", "TROPONINI" in the last 168 hours. BNP: Invalid input(s): "POCBNP" CBG: Recent Labs  Lab 02/25/24 1647 02/25/24 1839 02/25/24 2031 02/25/24 2325 02/26/24 0820  GLUCAP 165* 145* 141* 132* 118*   D-Dimer No results for input(s): "DDIMER" in the last 72 hours. Hgb A1c No results for input(s): "HGBA1C" in the last 72 hours. Lipid Profile No results for input(s): "CHOL", "HDL", "LDLCALC", "TRIG", "CHOLHDL", "LDLDIRECT" in the last 72 hours. Thyroid function studies No results for input(s): "TSH", "T4TOTAL", "T3FREE", "THYROIDAB" in the last 72 hours.  Invalid input(s): "FREET3" Anemia work up No results for input(s): "VITAMINB12", "FOLATE", "FERRITIN", "TIBC", "IRON", "RETICCTPCT" in the last 72 hours. Urinalysis    Component Value Date/Time   COLORURINE YELLOW 02/24/2024 0025   APPEARANCEUR HAZY (A) 02/24/2024 0025   LABSPEC 1.012 02/24/2024 0025   PHURINE 5.0 02/24/2024 0025   GLUCOSEU >=500 (A) 02/24/2024 0025   HGBUR SMALL (A) 02/24/2024 0025   BILIRUBINUR NEGATIVE 02/24/2024 0025   KETONESUR 20 (A) 02/24/2024 0025   PROTEINUR NEGATIVE 02/24/2024 0025   UROBILINOGEN 0.2 06/03/2012 0946   NITRITE NEGATIVE 02/24/2024 0025   LEUKOCYTESUR NEGATIVE 02/24/2024 0025   Sepsis Labs Recent  Labs  Lab 02/20/24 1912 02/24/24 2241 02/25/24 0739  WBC 4.4 11.3* 5.9   Microbiology Recent Results (from the past 240 hours)  Resp panel by RT-PCR (RSV, Flu A&B, Covid) Anterior Nasal Swab     Status: None   Collection Time: 02/24/24 10:41 PM   Specimen: Anterior Nasal Swab  Result Value Ref Range Status   SARS Coronavirus 2 by RT PCR NEGATIVE NEGATIVE Final   Influenza A by PCR NEGATIVE NEGATIVE Final   Influenza B by PCR NEGATIVE NEGATIVE Final    Comment: (NOTE) The Xpert Xpress SARS-CoV-2/FLU/RSV plus assay is intended as an aid in the diagnosis of influenza from  Nasopharyngeal swab specimens and should not be used as a sole basis for treatment. Nasal washings and aspirates are unacceptable for Xpert Xpress SARS-CoV-2/FLU/RSV testing.  Fact Sheet for Patients: BloggerCourse.com  Fact Sheet for Healthcare Providers: SeriousBroker.it  This test is not yet approved or cleared by the [place] FDA and has been authorized for detection and/or diagnosis of SARS-CoV-2 by FDA under an Emergency Use Authorization (EUA). This EUA will remain in effect (meaning this test can be used) for the duration of the COVID-19 declaration under Section 564(b)(1) of the Act, 21 U.S.C. section 360bbb-3(b)(1), unless the authorization is terminated or revoked.     Resp Syncytial Virus by PCR NEGATIVE NEGATIVE Final    Comment: (NOTE) Fact Sheet for Patients: BloggerCourse.com  Fact Sheet for Healthcare Providers: SeriousBroker.it  This test is not yet approved or cleared by the [place] FDA and has been authorized for detection and/or diagnosis of SARS-CoV-2 by FDA under an Emergency Use Authorization (EUA). This EUA will remain in effect (meaning this test can be used) for the duration of the COVID-19 declaration under Section 564(b)(1) of the Act, 21 U.S.C. section  360bbb-3(b)(1), unless the authorization is terminated or revoked.  Performed at [facility], [address], [place], [place] [address]     Please note: You were cared for by a hospitalist during your hospital stay. Once you are discharged, your primary care physician will handle any further medical issues. Please note that NO REFILLS for any discharge medications will be authorized once you are discharged, as it is imperative that you return to your primary care physician (or establish a relationship with a primary care physician if you do not have one) for your post hospital discharge needs so that they can reassess your need for medications and monitor your lab values.    Time coordinating discharge: 40 minutes  SIGNED:   Burnadette Pop, MD  [facility] 02/26/2024, 10:38 AM Pager [phone]  If 7PM-7AM, please contact night-coverage [email or web address] Password [email or web address]

## 2024-02-26 NOTE — Progress Notes (Signed)
 [facility] night cross cover note:   Prn iv hydralazine added for elevated blood pressure.  It is noted the patient refused her amlodipine yesterday.     Newton Pigg, DO Hospitalist

## 2024-02-26 NOTE — Plan of Care (Signed)

## 2024-02-29 LAB — SULFONYLUREA HYPOGLYCEMICS PANEL, SERUM
Acetohexamide: NEGATIVE ug/mL (ref 20–60)
Chlorpropamide: NEGATIVE ug/mL (ref 75–250)
Glimepiride: NEGATIVE ng/mL (ref 80–250)
Glipizide: NEGATIVE ng/mL (ref 200–1000)
Glyburide: NEGATIVE ng/mL
Nateglinide: NEGATIVE ng/mL
Repaglinide: NEGATIVE ng/mL
Tolazamide: NEGATIVE ug/mL
Tolbutamide: NEGATIVE ug/mL (ref 40–100)

## 2024-03-03 LAB — PROINSULIN/INSULIN RATIO
Insulin: 27 u[IU]/mL — ABNORMAL HIGH
Proinsulin: 29.5 pmol/L — ABNORMAL HIGH
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
## 2024-11-10 ENCOUNTER — Ambulatory Visit (HOSPITAL_COMMUNITY)
